# Patient Record
Sex: Female | Born: 1970 | Race: Black or African American | Hispanic: No | Marital: Single | Smoking: Never smoker
Health system: Southern US, Community
[De-identification: ages and names within clinical notes are randomized; demographics above are authoritative.]

## PROBLEM LIST (undated history)

## (undated) DIAGNOSIS — Z5189 Encounter for other specified aftercare: Secondary | ICD-10-CM

## (undated) DIAGNOSIS — R519 Headache, unspecified: Secondary | ICD-10-CM

## (undated) DIAGNOSIS — F419 Anxiety disorder, unspecified: Secondary | ICD-10-CM

## (undated) DIAGNOSIS — D219 Benign neoplasm of connective and other soft tissue, unspecified: Secondary | ICD-10-CM

## (undated) DIAGNOSIS — K219 Gastro-esophageal reflux disease without esophagitis: Secondary | ICD-10-CM

## (undated) DIAGNOSIS — M1990 Unspecified osteoarthritis, unspecified site: Secondary | ICD-10-CM

## (undated) DIAGNOSIS — I10 Essential (primary) hypertension: Secondary | ICD-10-CM

## (undated) DIAGNOSIS — D649 Anemia, unspecified: Secondary | ICD-10-CM

## (undated) HISTORY — PX: APPENDECTOMY: SHX54

---

## 2018-02-04 ENCOUNTER — Encounter (HOSPITAL_COMMUNITY): Payer: Self-pay | Admitting: Nurse Practitioner

## 2018-02-04 ENCOUNTER — Emergency Department (HOSPITAL_COMMUNITY)
Admission: EM | Admit: 2018-02-04 | Discharge: 2018-02-04 | Disposition: A | Discharge status: Home / Self Care | Payer: Self-pay | Attending: Emergency Medicine | Admitting: Emergency Medicine

## 2018-02-04 DIAGNOSIS — N939 Abnormal uterine and vaginal bleeding, unspecified: Secondary | ICD-10-CM

## 2018-02-04 DIAGNOSIS — D259 Leiomyoma of uterus, unspecified: Secondary | ICD-10-CM | POA: Insufficient documentation

## 2018-02-04 DIAGNOSIS — D649 Anemia, unspecified: Secondary | ICD-10-CM | POA: Insufficient documentation

## 2018-02-04 HISTORY — DX: Encounter for other specified aftercare: Z51.89

## 2018-02-04 HISTORY — DX: Benign neoplasm of connective and other soft tissue, unspecified: D21.9

## 2018-02-04 LAB — COMPREHENSIVE METABOLIC PANEL
ALBUMIN: 3.7 g/dL (ref 3.5–5.0)
ALT: 16 U/L (ref 14–54)
AST: 24 U/L (ref 15–41)
Alkaline Phosphatase: 52 U/L (ref 38–126)
Anion gap: 10 (ref 5–15)
BILIRUBIN TOTAL: 0.8 mg/dL (ref 0.3–1.2)
BUN: 10 mg/dL (ref 6–20)
CHLORIDE: 103 mmol/L (ref 101–111)
CO2: 24 mmol/L (ref 22–32)
Calcium: 8.8 mg/dL — ABNORMAL LOW (ref 8.9–10.3)
Creatinine, Ser: 0.97 mg/dL (ref 0.44–1.00)
GFR calc Af Amer: >60 mL/min (ref >60)
GFR calc non Af Amer: >60 mL/min (ref >60)
GLUCOSE: 99 mg/dL (ref 65–99)
POTASSIUM: 4.2 mmol/L (ref 3.5–5.1)
SODIUM: 137 mmol/L (ref 135–145)
Total Protein: 7.6 g/dL (ref 6.5–8.1)

## 2018-02-04 LAB — CBC
HCT: 36.4 % (ref 36.0–46.0)
Hemoglobin: 11.7 g/dL — ABNORMAL LOW (ref 12.0–15.0)
MCH: 27.8 pg (ref 26.0–34.0)
MCHC: 32.1 g/dL (ref 30.0–36.0)
MCV: 86.5 fL (ref 78.0–100.0)
Platelets: 267 10*3/uL (ref 150–400)
RBC: 4.21 MIL/uL (ref 3.87–5.11)
RDW: 15.5 % (ref 11.5–15.5)
WBC: 9.1 10*3/uL (ref 4.0–10.5)

## 2018-02-04 LAB — I-STAT BETA HCG BLOOD, ED (MC, WL, AP ONLY): I-stat hCG, quantitative: <5 m[IU]/mL (ref <5)

## 2018-02-04 LAB — LIPASE, BLOOD: Lipase: 26 U/L (ref 11–51)

## 2018-02-04 LAB — URINALYSIS, ROUTINE W REFLEX MICROSCOPIC
BILIRUBIN URINE: NEGATIVE
GLUCOSE, UA: NEGATIVE mg/dL
Hgb urine dipstick: NEGATIVE
KETONES UR: NEGATIVE mg/dL
Leukocytes, UA: NEGATIVE
Nitrite: NEGATIVE
PH: 6 (ref 5.0–8.0)
Protein, ur: NEGATIVE mg/dL
SPECIFIC GRAVITY, URINE: 1.024 (ref 1.005–1.030)

## 2018-02-04 MED ORDER — NAPROXEN 500 MG PO TABS: 500.0000 mg | ORAL_TABLET | Freq: Two times a day (BID) | ORAL | 0 refills | Status: DC

## 2018-02-04 MED ORDER — ONDANSETRON 4 MG PO TBDP
4.0000 mg | ORAL_TABLET | Freq: Once | ORAL | Status: AC | PRN
  Administered 2018-02-04: 4 mg via ORAL
  Filled 2018-02-04: qty 1

## 2018-02-04 MED ORDER — OXYCODONE-ACETAMINOPHEN 5-325 MG PO TABS
1.0000 | ORAL_TABLET | ORAL | Status: DC | PRN
  Administered 2018-02-04: 1 via ORAL
  Filled 2018-02-04: qty 1

## 2018-02-04 NOTE — ED Triage Notes (Signed)
Pt endorses weakness and lower abdominal pain, fatigue and had vaginal bleeding x14 days stated stopped bleeding 2 days ago. Pt denies nasuea, vomiting, diarrhea, decrease appetite, dysuria, vaginal discharge. Pt endorses feeling cold all the time. Pt has had appendectomy.   States has hx of fibroids requiring blood transfusions.

## 2018-02-04 NOTE — ED Notes (Signed)
Pt verbalized understanding of d/c instructions and has no further questions. VSS, NAD.  

## 2018-02-04 NOTE — Discharge Instructions (Signed)
Make sure you follow-up with the [facility] for evaluation of your fibroids.  Do not take ibuprofen, Aleve, Advil, or other NSAIDs while you are on the Naproxen.

## 2018-02-04 NOTE — ED Provider Notes (Signed)
[facility] EMERGENCY DEPARTMENT Provider Note   CSN: [id number] Arrival date & time: 02/04/18  1006     History   Chief Complaint No chief complaint on file.   HPI Stefanie Moon is a 47 y.o. female.  HPI  47 year old female with a history of fibroids diagnosed a few months ago in [place] presents with heavy vaginal bleeding for 2 weeks.  Is also been feeling weak and run down with no energy.  She states the bleeding is heavy with clots.  She had an ultrasound in [facility] that showed multiple uterine fibroids.  She has moved to this area but does not have an OB/GYN.  She has been having lower abdominal pain that comes and goes, especially with certain movements.  No vomiting.  Denies any dysuria or vaginal discharge.  She has tried ibuprofen without relief.  She is feeling overall weak like she has no energy.  She describes shortness of breath for several months but denies any chest pain or cough.  When noted that she has hypertension she states she is always hypertensive whenever she is in such significant pain but denies a known history of hypertension. She was given a percocet in the waiting room with partial relief.   Past Medical History:  Diagnosis Date  . Blood transfusion without reported diagnosis   . Fibroids     There are no active problems to display for this patient.   Past Surgical History:  Procedure Laterality Date  . APPENDECTOMY      OB History    No data available       Home Medications    Prior to Admission medications   Medication Sig Start Date End Date Taking? Authorizing Provider  naproxen (NAPROSYN) 500 MG tablet Take 1 tablet (500 mg total) by mouth 2 (two) times daily with a meal. 02/04/18   Sherwood Gambler, MD    Family History No family history on file.  Social History Social History   Tobacco Use  . Smoking status: Never Smoker  . Smokeless tobacco: Never Used  Substance Use Topics  . Alcohol use: Yes      Comment: socially   . Drug use: No     Allergies   Patient has no known allergies.   Review of Systems Review of Systems  Constitutional: Positive for fatigue.  Respiratory: Positive for shortness of breath.   Cardiovascular: Negative for chest pain.  Gastrointestinal: Positive for abdominal pain. Negative for vomiting.  Genitourinary: Positive for vaginal bleeding. Negative for dysuria and vaginal discharge.  Neurological: Positive for weakness.  All other systems reviewed and are negative.    Physical Exam Updated Vital Signs BP (!) 169/114   Pulse 78   Temp 98.4 F (36.9 C) (Oral)   Resp 16   Ht 5\' 4"  (1.626 m)   Wt 91.2 kg (201 lb)   LMP 02/01/2018   SpO2 98%   BMI 34.50 kg/m   Physical Exam  Constitutional: She is oriented to person, place, and time. She appears well-developed and well-nourished. No distress.  HENT:  Head: Normocephalic and atraumatic.  Right Ear: External ear normal.  Left Ear: External ear normal.  Nose: Nose normal.  Eyes: EOM are normal. Pupils are equal, round, and reactive to light. Right eye exhibits no discharge. Left eye exhibits no discharge.  Cardiovascular: Normal rate, regular rhythm and normal heart sounds.  Pulmonary/Chest: Effort normal and breath sounds normal.  Abdominal: Soft. There is tenderness in the suprapubic area.  Neurological: She is alert and oriented to person, place, and time.  CN 3-12 grossly intact. 5/5 strength in all 4 extremities. Grossly normal sensation. Normal finger to nose.   Skin: Skin is warm and dry. She is not diaphoretic.  Nursing note and vitals reviewed.    ED Treatments / Results  Labs (all labs ordered are listed, but only abnormal results are displayed) Labs Reviewed  COMPREHENSIVE METABOLIC PANEL - Abnormal; Notable for the following components:      Result Value   Calcium 8.8 (*)    All other components within normal limits  CBC - Abnormal; Notable for the following components:    Hemoglobin 11.7 (*)    All other components within normal limits  LIPASE, BLOOD  URINALYSIS, ROUTINE W REFLEX MICROSCOPIC  I-STAT BETA HCG BLOOD, ED ([facility], [facility], [facility] ONLY)    EKG  EKG Interpretation None       Radiology No results found.  Procedures Procedures (including critical care time)  Medications Ordered in ED Medications  oxyCODONE-acetaminophen (PERCOCET/ROXICET) 5-325 MG per tablet 1 tablet (1 tablet Oral Given 02/04/18 1158)  ondansetron (ZOFRAN-ODT) disintegrating tablet 4 mg (4 mg Oral Given 02/04/18 1157)     Initial Impression / Assessment and Plan / ED Course  I have reviewed the triage vital signs and the nursing notes.  Pertinent labs & imaging results that were available during my care of the patient were reviewed by me and considered in my medical decision making (see chart for details).     Patient's presentation is consistent with bleeding from fibroids.  She has mild anemia with a hemoglobin of 11.5, no old to compare to.  However her vital signs show no signs of shock.  She is actually hypertensive and we discussed getting an ECG given her feeling of weakness and fatigue but she declines.  I also offered pelvic exam but she states she knows where the vaginal bleeding is coming from and just needs someone to help stop it.  I will give her a stronger NSAID but I do not think that narcotics are warranted at this time.  She otherwise appears well.  I do not think CT imaging or ultrasound would help at this time as we know the diagnosis.  Otherwise she appears stable for discharge home and we discussed starting on iron supplementation.  Follow-up with OB/GYN.  Discussed return precautions.  My suspicion of ACS or acute neurologic emergency is low.  Final Clinical Impressions(s) / ED Diagnoses   Final diagnoses:  Uterine leiomyoma, unspecified location  Vaginal bleeding  Anemia, unspecified type    ED Discharge Orders        Ordered    naproxen (NAPROSYN) 500  MG tablet  2 times daily with meals     02/04/18 1525       Sherwood Gambler, MD 02/04/18 1530

## 2018-04-08 ENCOUNTER — Encounter (HOSPITAL_COMMUNITY): Payer: Self-pay | Admitting: *Deleted

## 2018-04-08 ENCOUNTER — Inpatient Hospital Stay (HOSPITAL_COMMUNITY)
Admission: AD | Admit: 2018-04-08 | Discharge: 2018-04-08 | Discharge status: Against Medical Advice | Payer: Self-pay | Source: Ambulatory Visit | Attending: Obstetrics and Gynecology | Admitting: Obstetrics and Gynecology

## 2018-04-08 DIAGNOSIS — Z5321 Procedure and treatment not carried out due to patient leaving prior to being seen by health care provider: Secondary | ICD-10-CM | POA: Insufficient documentation

## 2018-04-08 DIAGNOSIS — R1030 Lower abdominal pain, unspecified: Secondary | ICD-10-CM | POA: Insufficient documentation

## 2018-04-08 DIAGNOSIS — N939 Abnormal uterine and vaginal bleeding, unspecified: Secondary | ICD-10-CM | POA: Insufficient documentation

## 2018-04-08 LAB — URINALYSIS, ROUTINE W REFLEX MICROSCOPIC
BILIRUBIN URINE: NEGATIVE
Glucose, UA: NEGATIVE mg/dL
Ketones, ur: NEGATIVE mg/dL
LEUKOCYTES UA: NEGATIVE
NITRITE: NEGATIVE
PROTEIN: NEGATIVE mg/dL
Specific Gravity, Urine: 1.02 (ref 1.005–1.030)
pH: 5 (ref 5.0–8.0)

## 2018-04-08 LAB — POCT PREGNANCY, URINE: PREG TEST UR: NEGATIVE

## 2018-04-08 NOTE — MAU Note (Addendum)
Pt C/O bleeding for the past 15 days, has no energy, states she blacked out at work today, has been very dizzy.  Fills the toilet with clots when she goes to the [place].  Also C/O severe lower abd cramping & pelvic pressure.  Hx of fibroids.

## 2018-04-08 NOTE — MAU Note (Signed)
Pt informed registration staff that she is unable to stay, has to pick up her children, wants to come back later.

## 2018-06-23 ENCOUNTER — Emergency Department (HOSPITAL_COMMUNITY)
Admission: EM | Admit: 2018-06-23 | Discharge: 2018-06-23 | Disposition: A | Discharge status: Home / Self Care | Payer: Self-pay | Attending: Emergency Medicine | Admitting: Emergency Medicine

## 2018-06-23 ENCOUNTER — Emergency Department (HOSPITAL_COMMUNITY): Payer: Self-pay

## 2018-06-23 ENCOUNTER — Encounter (HOSPITAL_COMMUNITY): Payer: Self-pay | Admitting: Emergency Medicine

## 2018-06-23 DIAGNOSIS — R930 Abnormal findings on diagnostic imaging of skull and head, not elsewhere classified: Secondary | ICD-10-CM | POA: Insufficient documentation

## 2018-06-23 DIAGNOSIS — M5412 Radiculopathy, cervical region: Secondary | ICD-10-CM | POA: Insufficient documentation

## 2018-06-23 DIAGNOSIS — Z79899 Other long term (current) drug therapy: Secondary | ICD-10-CM | POA: Insufficient documentation

## 2018-06-23 LAB — CBC WITH DIFFERENTIAL/PLATELET
Basophils Absolute: 0 10*3/uL (ref 0.0–0.1)
Basophils Relative: 0 %
Eosinophils Absolute: 0.2 10*3/uL (ref 0.0–0.7)
Eosinophils Relative: 3 %
HEMATOCRIT: 29.1 % — AB (ref 36.0–46.0)
HEMOGLOBIN: 8.8 g/dL — AB (ref 12.0–15.0)
Lymphocytes Relative: 19 %
Lymphs Abs: 1.6 10*3/uL (ref 0.7–4.0)
MCH: 24.2 pg — ABNORMAL LOW (ref 26.0–34.0)
MCHC: 30.2 g/dL (ref 30.0–36.0)
MCV: 80.2 fL (ref 78.0–100.0)
Monocytes Absolute: 0.5 10*3/uL (ref 0.1–1.0)
Monocytes Relative: 6 %
NEUTROS ABS: 6.1 10*3/uL (ref 1.7–7.7)
NEUTROS PCT: 72 %
Platelets: 378 10*3/uL (ref 150–400)
RBC: 3.63 MIL/uL — AB (ref 3.87–5.11)
RDW: 14.5 % (ref 11.5–15.5)
WBC: 8.5 10*3/uL (ref 4.0–10.5)

## 2018-06-23 LAB — COMPREHENSIVE METABOLIC PANEL
ALBUMIN: 3.6 g/dL (ref 3.5–5.0)
ALK PHOS: 46 U/L (ref 38–126)
ALT: 13 U/L (ref 0–44)
AST: 16 U/L (ref 15–41)
Anion gap: 7 (ref 5–15)
BILIRUBIN TOTAL: 0.8 mg/dL (ref 0.3–1.2)
BUN: 13 mg/dL (ref 6–20)
CALCIUM: 8.7 mg/dL — AB (ref 8.9–10.3)
CO2: 25 mmol/L (ref 22–32)
CREATININE: 1.06 mg/dL — AB (ref 0.44–1.00)
Chloride: 105 mmol/L (ref 98–111)
GFR calc Af Amer: >60 mL/min (ref >60)
GFR calc non Af Amer: >60 mL/min (ref >60)
GLUCOSE: 118 mg/dL — AB (ref 70–99)
Potassium: 3.7 mmol/L (ref 3.5–5.1)
Sodium: 137 mmol/L (ref 135–145)
TOTAL PROTEIN: 7.3 g/dL (ref 6.5–8.1)

## 2018-06-23 LAB — TROPONIN I: Troponin I: <0.03 ng/mL (ref <0.03)

## 2018-06-23 MED ORDER — HYDROMORPHONE HCL 1 MG/ML IJ SOLN
1.0000 mg | Freq: Once | INTRAMUSCULAR | Status: AC
  Administered 2018-06-23: 1 mg via INTRAVENOUS
  Filled 2018-06-23: qty 1

## 2018-06-23 MED ORDER — PREDNISONE 20 MG PO TABS: ORAL_TABLET | ORAL | 0 refills | Status: DC

## 2018-06-23 MED ORDER — LORAZEPAM 2 MG/ML IJ SOLN
1.0000 mg | Freq: Once | INTRAMUSCULAR | Status: AC
  Administered 2018-06-23: 1 mg via INTRAVENOUS
  Filled 2018-06-23: qty 1

## 2018-06-23 MED ORDER — METHYLPREDNISOLONE SODIUM SUCC 125 MG IJ SOLR
125.0000 mg | Freq: Once | INTRAMUSCULAR | Status: AC
  Administered 2018-06-23: 125 mg via INTRAVENOUS
  Filled 2018-06-23: qty 2

## 2018-06-23 MED ORDER — SODIUM CHLORIDE 0.9 % IV BOLUS: 1000.0000 mL | Freq: Once | INTRAVENOUS | Status: DC

## 2018-06-23 MED ORDER — MORPHINE SULFATE (PF) 4 MG/ML IV SOLN
4.0000 mg | Freq: Once | INTRAVENOUS | Status: AC
  Administered 2018-06-23: 4 mg via INTRAVENOUS
  Filled 2018-06-23: qty 1

## 2018-06-23 MED ORDER — GABAPENTIN 300 MG PO CAPS: 300.0000 mg | ORAL_CAPSULE | Freq: Two times a day (BID) | ORAL | 0 refills | Status: DC

## 2018-06-23 MED ORDER — GADOBENATE DIMEGLUMINE 529 MG/ML IV SOLN
20.0000 mL | Freq: Once | INTRAVENOUS | Status: AC | PRN
  Administered 2018-06-23: 20 mL via INTRAVENOUS

## 2018-06-23 NOTE — ED Notes (Signed)
Report given to MRI.

## 2018-06-23 NOTE — Discharge Instructions (Signed)
Take steroids as prescribed. You have a pinched nerve in your neck causing your symptoms.   Take gabapentin as prescribed.   See neurology for nonspecific changes in your MRI brain   See neurosurgeon for the pinched nerve in your neck   Return to ER if you have worse numbness, weakness, trouble speaking, blurry vision

## 2018-06-23 NOTE — ED Notes (Signed)
Pt transported to Xray at this time.

## 2018-06-23 NOTE — ED Triage Notes (Signed)
Pt reports started a new job 4 months ago where does a lot of work with her hands/arms. Pt repots having pains, numbness and swelling in bilat upper extremities that got to yesterday she wasn't able to hold onto things and dropping.

## 2018-06-23 NOTE — ED Provider Notes (Signed)
[facility] DEPT Provider Note   CSN: [id number] Arrival date & time: 06/23/18  0950     History   Chief Complaint Chief Complaint  Patient presents with  . Arm Pain  . Hand Pain    HPI Stefanie Moon is a 47 y.o. female hx of fibroids, here presenting with numbness, weakness.  Patient states that she is at her current job for the last 4 months.  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  For the last week or so, she has progressive bilateral arm numbness and tingling.  She also noticed that since yesterday she has been feeling weak on the right arm and has been dropping things.  She has intermittent blurry vision for the last several months that is not getting worse.  Denies trouble walking or trouble speaking.  Patient denies any neck injury or fevers and adamantly denies any drug use. Had no previous history of neck problems or multiple sclerosis.   The history is provided by the patient.    Past Medical History:  Diagnosis Date  . Blood transfusion without reported diagnosis   . Fibroids     There are no active problems to display for this patient.   Past Surgical History:  Procedure Laterality Date  . APPENDECTOMY       OB History   None      Home Medications    Prior to Admission medications   Medication Sig Start Date End Date Taking? Authorizing Provider  Cyanocobalamin (VITAMIN B-12 PO) Take 5,000 mcg by mouth daily.   Yes [provider]  ibuprofen (ADVIL,MOTRIN) 800 MG tablet Take 800 mg by mouth See admin instructions. Pt received one tablet from cousin, does not have any more.   Yes [provider]  naproxen (NAPROSYN) 500 MG tablet Take 1 tablet (500 mg total) by mouth 2 (two) times daily with a meal. 02/04/18  Yes Sherwood Gambler, MD  naproxen sodium (ALEVE) 220 MG tablet Take 440 mg by mouth daily as needed (pain).   Yes [provider]  polyethylene glycol powder  (MIRALAX) powder Take 1 Container by mouth daily as needed for mild constipation.   Yes [provider]    Family History No family history on file.  Social History Social History   Tobacco Use  . Smoking status: Never Smoker  . Smokeless tobacco: Never Used  Substance Use Topics  . Alcohol use: Yes    Comment: socially   . Drug use: No     Allergies   Tramadol   Review of Systems Review of Systems  Neurological: Positive for weakness and numbness.  All other systems reviewed and are negative.    Physical Exam Updated Vital Signs BP (!) 153/97 (BP Location: Left Arm)   Pulse 96   Temp 98.2 F (36.8 C) (Oral)   Resp 16   LMP 06/09/2018   SpO2 97%   Physical Exam  Constitutional: She is oriented to person, place, and time. She appears well-developed and well-nourished.  HENT:  Head: Normocephalic.  Mouth/Throat: Oropharynx is clear and moist.  Eyes: Pupils are equal, round, and reactive to light. Conjunctivae and EOM are normal.  Neck: Normal range of motion. Neck supple.  Mild bilateral paracervical tenderness   Cardiovascular: Normal rate, regular rhythm and normal heart sounds.  Pulmonary/Chest: Effort normal and breath sounds normal. No stridor. No respiratory distress. She has no wheezes.  Abdominal: Soft. Bowel sounds are normal. She  exhibits no distension. There is no tenderness. There is no guarding.  Musculoskeletal: Normal range of motion.  Neurological: She is alert and oriented to person, place, and time.  CN 2- 12 intact. Slightly dec sensation bilateral 3rd to 5th fingers. Slightly dec hand grasp bilaterally but nl elbow flexion and extension. Nl finger to nose bilaterally, nl strength bilateral lower extremities   Skin: Skin is warm.  Psychiatric: She has a normal mood and affect.  Nursing note and vitals reviewed.    ED Treatments / Results  Labs (all labs ordered are listed, but only abnormal results are displayed) Labs Reviewed    CBC WITH DIFFERENTIAL/PLATELET - Abnormal; Notable for the following components:      Result Value   RBC 3.63 (*)    Hemoglobin 8.8 (*)    HCT 29.1 (*)    MCH 24.2 (*)    All other components within normal limits  COMPREHENSIVE METABOLIC PANEL - Abnormal; Notable for the following components:   Glucose, Bld 118 (*)    Creatinine, Ser 1.06 (*)    Calcium 8.7 (*)    All other components within normal limits  TROPONIN I    EKG EKG Interpretation  Date/Time:  Thursday June 23 2018 10:55:28 EDT Ventricular Rate:  70 PR Interval:    QRS Duration: 76 QT Interval:  394 QTC Calculation: 426 R Axis:   24 Text Interpretation:  Sinus rhythm Abnormal R-wave progression, early transition No previous ECGs available Confirmed by Wandra Arthurs [phone]) on 06/23/2018 11:00:52 AM Also confirmed by Wandra Arthurs [phone]), editor Lynder Parents [phone])  on 06/23/2018 2:06:03 PM   Radiology Dg Chest 2 View  Result Date: 06/23/2018 CLINICAL DATA:  Worsening bilat arm pain, tingling and loss of ability to hold things over last 2 months, some new chest pain, dizziness, sob on exertion same time frame, nonsmoker, no other chest complaints EXAM: CHEST - 2 VIEW COMPARISON:  none FINDINGS: Lungs are clear. Heart size and mediastinal contours are within normal limits. No effusion.  No pneumothorax. Visualized bones unremarkable. IMPRESSION: No acute cardiopulmonary disease. Electronically Signed   By: Lucrezia Europe M.D.   On: 06/23/2018 10:56   Mr Brain W And Wo Contrast  Result Date: 06/23/2018 CLINICAL DATA:  Dizziness and blurry vision. Bilateral arm numbness and weakness. Multiple sclerosis, new event EXAM: MRI HEAD WITHOUT AND WITH CONTRAST MRI CERVICAL SPINE WITHOUT AND WITH CONTRAST TECHNIQUE: Multiplanar, multiecho pulse sequences of the brain and surrounding structures, and cervical spine, to include the craniocervical junction and cervicothoracic junction, were obtained without and with intravenous  contrast. CONTRAST:  33mL MULTIHANCE GADOBENATE DIMEGLUMINE 529 MG/ML IV SOLN COMPARISON:  None. FINDINGS: MRI HEAD FINDINGS Brain: 3 FLAIR hyperintense foci in the cerebral white matter, juxtacortical in the right temporal pole and in the deep white matter in the left cerebrum. No infratentorial signal abnormality. No enhancement or restricted diffusion. No acute infarct, hemorrhage, hydrocephalus, or collection. Brain volume is normal Vascular: Major flow voids and vascular enhancements are preserved Skull and upper cervical spine: No evidence of marrow lesion. Sinuses/Orbits: T2 hyperintense lobulation within the left sphenoid sinus that is primarily nonenhancing. There is a central T1 hyperintense focus that is likely proteinaceous material. This is most consistent with a retention cyst. No indication of acute sinusitis. MRI CERVICAL SPINE FINDINGS Alignment: Straightening of the cervical spine Vertebrae: No fracture, evidence of discitis, or bone lesion. Cord: Normal signal and morphology. 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 Disc levels: C2-3: Minor right-sided facet spurring on sagittal images. Tiny central protrusion C3-4: Disc narrowing and bulging with endplate ridging. The canal and foramina remain patent C4-5: Mild disc narrowing and bulging with minimal ridging. Patent canal and foramina C5-6: Spondylosis. Disc narrowing and bulging with asymmetric left uncovertebral ridging. Moderate left foraminal stenosis. Leftward disc osteophyte complex mildly flattens the left cord C6-7: Shallow central protrusion.  Patent canal and foramina C7-T1:Unremarkable. IMPRESSION: Brain MRI: 1. 3 small signal abnormalities in the cerebral white matter with nonspecific pattern. 2. No acute finding.  No evidence of active demyelination. Cervical MRI: 1. No evidence of cord demyelination or other acute disease. 2. 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 Disc osteophyte complex at the same level causes mild left ventral cord flattening. Electronically Signed   By: Monte Fantasia M.D.   On: 06/23/2018 13:10   Mr Cervical Spine W Or Wo Contrast  Result Date: 06/23/2018 CLINICAL DATA:  Dizziness and blurry vision. Bilateral arm numbness and weakness. Multiple sclerosis, new event EXAM: MRI HEAD WITHOUT AND WITH CONTRAST MRI CERVICAL SPINE WITHOUT AND WITH CONTRAST TECHNIQUE: Multiplanar, multiecho pulse sequences of the brain and surrounding structures, and cervical spine, to include the craniocervical junction and cervicothoracic junction, were obtained without and with intravenous contrast. CONTRAST:  61mL MULTIHANCE GADOBENATE DIMEGLUMINE 529 MG/ML IV SOLN COMPARISON:  None. FINDINGS: MRI HEAD FINDINGS Brain: 3 FLAIR hyperintense foci in the cerebral white matter, juxtacortical in the right temporal pole and in the deep white matter in the left cerebrum. No infratentorial signal abnormality. No enhancement or restricted diffusion. No acute infarct, hemorrhage, hydrocephalus, or collection. Brain volume is normal Vascular: Major flow voids and vascular enhancements are preserved Skull and upper cervical spine: No evidence of marrow lesion. Sinuses/Orbits: T2 hyperintense lobulation within the left sphenoid sinus that is primarily nonenhancing. There is a central T1 hyperintense focus that is likely proteinaceous material. This is most consistent with a retention cyst. No indication of acute sinusitis. MRI CERVICAL SPINE FINDINGS Alignment: Straightening of the cervical spine Vertebrae: No fracture, evidence of discitis, or bone lesion. Cord: Normal signal and morphology. 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 Disc levels: C2-3: Minor right-sided facet spurring on sagittal images. Tiny central protrusion C3-4: Disc narrowing and bulging with endplate ridging. The  canal and foramina remain patent C4-5: Mild disc narrowing and bulging with minimal ridging. Patent canal and foramina C5-6: Spondylosis. Disc narrowing and bulging with asymmetric left uncovertebral ridging. Moderate left foraminal stenosis. Leftward disc osteophyte complex mildly flattens the left cord C6-7: Shallow central protrusion.  Patent canal and foramina C7-T1:Unremarkable. IMPRESSION: Brain MRI: 1. 3 small signal abnormalities in the cerebral white matter with nonspecific pattern. 2. No acute finding.  No evidence of active demyelination. Cervical MRI: 1. No evidence of cord demyelination or other acute disease. 2. Multilevel disc degeneration with moderate left foraminal narrowing at C5-6. Disc osteophyte complex at the same level causes mild left ventral cord flattening. Electronically Signed   By: Monte Fantasia M.D.   On: 06/23/2018 13:10    Procedures Procedures (including critical care time)  Medications Ordered in ED Medications  morphine 4 MG/ML injection 4 mg (4 mg Intravenous Given 06/23/18 1020)  HYDROmorphone (DILAUDID) injection 1 mg (1 mg Intravenous Given 06/23/18 1107)  LORazepam (ATIVAN) injection 1 mg (1 mg Intravenous Given 06/23/18 1107)  gadobenate dimeglumine (MULTIHANCE) injection 20 mL (20 mLs Intravenous Contrast Given 06/23/18 1250)  methylPREDNISolone sodium succinate (SOLU-MEDROL) 125 mg/2 mL injection 125 mg (125 mg Intravenous Given 06/23/18 1414)     Initial Impression / Assessment and Plan / ED Course  I have reviewed the triage vital signs and the nursing notes.  Pertinent labs & imaging results that were available during my care of the patient were reviewed by me and considered in my medical decision making (see chart for details).     Stefanie Moon is a 47 y.o. female here with bilateral arm numbness, weakness. Consider peripheral radiculopathy vs multiple sclerosis vs bilateral carpel tunnel syndrome. Will get labs, MRI brain/cervical spine. Will  give pain meds.   2:17 PM MRI showed nonspecific signal abnormalities in the cerebral white matter but no signs of MS. MRI cervical spine showed C5-6 osteophyte complex. I think this likely caused her radiculopathy. I talked to Dr. Leonel Ramsay from neurology. He recommend outpatient neurology follow up. I ordered steroids. Will dc home with steroid dose pack, neurosurgery referral as well.    Final Clinical Impressions(s) / ED Diagnoses   Final diagnoses:  None    ED Discharge Orders    None       Drenda Freeze, MD 06/23/18 1421

## 2018-06-23 NOTE — ED Notes (Signed)
Pt reports pain is unchanged. MD made aware

## 2018-06-23 NOTE — ED Notes (Signed)
PA at bedside.

## 2018-06-23 NOTE — ED Notes (Signed)
Pt transported to MRI at this time 

## 2018-09-01 ENCOUNTER — Encounter (HOSPITAL_COMMUNITY): Payer: Self-pay | Admitting: *Deleted

## 2018-09-01 ENCOUNTER — Emergency Department (HOSPITAL_COMMUNITY): Payer: Self-pay

## 2018-09-01 ENCOUNTER — Other Ambulatory Visit: Payer: Self-pay

## 2018-09-01 ENCOUNTER — Emergency Department (HOSPITAL_COMMUNITY)
Admission: EM | Admit: 2018-09-01 | Discharge: 2018-09-01 | Disposition: A | Discharge status: Home / Self Care | Payer: Self-pay | Attending: Emergency Medicine | Admitting: Emergency Medicine

## 2018-09-01 DIAGNOSIS — R0602 Shortness of breath: Secondary | ICD-10-CM | POA: Insufficient documentation

## 2018-09-01 DIAGNOSIS — B349 Viral infection, unspecified: Secondary | ICD-10-CM | POA: Insufficient documentation

## 2018-09-01 DIAGNOSIS — R070 Pain in throat: Secondary | ICD-10-CM | POA: Insufficient documentation

## 2018-09-01 DIAGNOSIS — R110 Nausea: Secondary | ICD-10-CM | POA: Insufficient documentation

## 2018-09-01 DIAGNOSIS — R1030 Lower abdominal pain, unspecified: Secondary | ICD-10-CM | POA: Insufficient documentation

## 2018-09-01 DIAGNOSIS — R509 Fever, unspecified: Secondary | ICD-10-CM | POA: Insufficient documentation

## 2018-09-01 DIAGNOSIS — H1033 Unspecified acute conjunctivitis, bilateral: Secondary | ICD-10-CM | POA: Insufficient documentation

## 2018-09-01 DIAGNOSIS — R55 Syncope and collapse: Secondary | ICD-10-CM | POA: Insufficient documentation

## 2018-09-01 LAB — COMPREHENSIVE METABOLIC PANEL
ALT: 16 U/L (ref 0–44)
AST: 19 U/L (ref 15–41)
Albumin: 3.7 g/dL (ref 3.5–5.0)
Alkaline Phosphatase: 51 U/L (ref 38–126)
Anion gap: 10 (ref 5–15)
BILIRUBIN TOTAL: 0.7 mg/dL (ref 0.3–1.2)
BUN: 10 mg/dL (ref 6–20)
CO2: 26 mmol/L (ref 22–32)
Calcium: 8.9 mg/dL (ref 8.9–10.3)
Chloride: 103 mmol/L (ref 98–111)
Creatinine, Ser: 1.03 mg/dL — ABNORMAL HIGH (ref 0.44–1.00)
GFR calc non Af Amer: >60 mL/min (ref >60)
Glucose, Bld: 101 mg/dL — ABNORMAL HIGH (ref 70–99)
POTASSIUM: 3.4 mmol/L — AB (ref 3.5–5.1)
Sodium: 139 mmol/L (ref 135–145)
TOTAL PROTEIN: 8.1 g/dL (ref 6.5–8.1)

## 2018-09-01 LAB — CBC WITH DIFFERENTIAL/PLATELET
BASOS ABS: 0 10*3/uL (ref 0.0–0.1)
Basophils Relative: 0 %
EOS ABS: 0.1 10*3/uL (ref 0.0–0.7)
Eosinophils Relative: 2 %
HCT: 29.4 % — ABNORMAL LOW (ref 36.0–46.0)
Hemoglobin: 8.9 g/dL — ABNORMAL LOW (ref 12.0–15.0)
Lymphocytes Relative: 18 %
Lymphs Abs: 1.3 10*3/uL (ref 0.7–4.0)
MCH: 22.5 pg — ABNORMAL LOW (ref 26.0–34.0)
MCHC: 30.3 g/dL (ref 30.0–36.0)
MCV: 74.4 fL — ABNORMAL LOW (ref 78.0–100.0)
Monocytes Absolute: 1 10*3/uL (ref 0.1–1.0)
Monocytes Relative: 14 %
Neutro Abs: 4.7 10*3/uL (ref 1.7–7.7)
Neutrophils Relative %: 66 %
PLATELETS: 292 10*3/uL (ref 150–400)
RBC: 3.95 MIL/uL (ref 3.87–5.11)
RDW: 16.2 % — ABNORMAL HIGH (ref 11.5–15.5)
WBC: 7.1 10*3/uL (ref 4.0–10.5)

## 2018-09-01 LAB — URINALYSIS, ROUTINE W REFLEX MICROSCOPIC
BILIRUBIN URINE: NEGATIVE
Glucose, UA: NEGATIVE mg/dL
Hgb urine dipstick: NEGATIVE
Ketones, ur: NEGATIVE mg/dL
LEUKOCYTES UA: NEGATIVE
Nitrite: NEGATIVE
PROTEIN: NEGATIVE mg/dL
Specific Gravity, Urine: 1.012 (ref 1.005–1.030)
pH: 6 (ref 5.0–8.0)

## 2018-09-01 LAB — LIPASE, BLOOD: LIPASE: 28 U/L (ref 11–51)

## 2018-09-01 LAB — PREGNANCY, URINE: PREG TEST UR: NEGATIVE

## 2018-09-01 MED ORDER — MORPHINE SULFATE (PF) 4 MG/ML IV SOLN
4.0000 mg | Freq: Once | INTRAVENOUS | Status: AC
  Administered 2018-09-01: 4 mg via INTRAVENOUS
  Filled 2018-09-01: qty 1

## 2018-09-01 MED ORDER — SODIUM CHLORIDE 0.9 % IJ SOLN
INTRAMUSCULAR | Status: AC
  Filled 2018-09-01: qty 50

## 2018-09-01 MED ORDER — SODIUM CHLORIDE 0.9 % IV BOLUS
1000.0000 mL | Freq: Once | INTRAVENOUS | Status: AC
Start: 2018-09-01 — End: 2018-09-01
  Administered 2018-09-01: 1000 mL via INTRAVENOUS

## 2018-09-01 MED ORDER — ARTIFICIAL TEARS OPHTHALMIC OINT
TOPICAL_OINTMENT | Freq: Once | OPHTHALMIC | Status: AC
  Administered 2018-09-01: 10:00:00 via OPHTHALMIC
  Filled 2018-09-01: qty 3.5

## 2018-09-01 MED ORDER — ONDANSETRON HCL 4 MG/2ML IJ SOLN
4.0000 mg | Freq: Once | INTRAMUSCULAR | Status: AC
  Administered 2018-09-01: 4 mg via INTRAVENOUS
  Filled 2018-09-01: qty 2

## 2018-09-01 MED ORDER — IOPAMIDOL (ISOVUE-300) INJECTION 61%
INTRAVENOUS | Status: AC
  Administered 2018-09-01: 100 mL
  Filled 2018-09-01: qty 100

## 2018-09-01 NOTE — ED Triage Notes (Signed)
Pt complains of burning in eyes, generalized body aches, chills since coming [place] from the beach last week. Pt states she lost consciousness last night when she got out of bed to go to the bathroom. Pt states she had temperature of 102 last night.

## 2018-09-01 NOTE — ED Provider Notes (Signed)
[facility] DEPT Provider Note   CSN: [id number] Arrival date & time: 09/01/18  0848     History   Chief Complaint Chief Complaint  Patient presents with  . Eye Pain  . Generalized Body Aches    HPI Stefanie Moon is a 47 y.o. female.  She is complaining of being sick since she went to the beach on Thursday.  She said she was in the ocean on Saturday and on Sunday morning she woke up with eye redness sore throat painful neck glands nausea shortness of breath.  She is had the symptoms since then.  Last night getting up out of bed she had a syncopal event and then was found to have a fever of 102 by her boyfriend.  She continues to have red eyes with some watery discharge that are crusted in the morning.  Associate with some photophobia.  No sick contacts.  No vomiting no diarrhea, he is constipated and is trying some Dulcolax.  No urinary symptoms no vaginal bleeding or discharge.  The history is provided by the patient.  Illness  This is a new problem. Episode onset: 4 days. The problem occurs constantly. The problem has been gradually worsening. Associated symptoms include abdominal pain, headaches and shortness of breath. Pertinent negatives include no chest pain. Nothing aggravates the symptoms. Nothing relieves the symptoms. She has tried nothing for the symptoms. The treatment provided no relief.    Past Medical History:  Diagnosis Date  . Blood transfusion without reported diagnosis   . Fibroids     There are no active problems to display for this patient.   Past Surgical History:  Procedure Laterality Date  . APPENDECTOMY       OB History   None      Home Medications    Prior to Admission medications   Medication Sig Start Date End Date Taking? Authorizing Provider  Cyanocobalamin (VITAMIN B-12 PO) Take 5,000 mcg by mouth daily.    [provider]  gabapentin (NEURONTIN) 300 MG capsule Take 1 capsule (300 mg total) by  mouth 2 (two) times daily. 06/23/18   Drenda Freeze, MD  naproxen (NAPROSYN) 500 MG tablet Take 1 tablet (500 mg total) by mouth 2 (two) times daily with a meal. 02/04/18   Sherwood Gambler, MD  predniSONE (DELTASONE) 20 MG tablet Take 60 mg daily x 2 days then 40 mg daily x 2 days then 20 mg daily x 2 days 06/23/18   Drenda Freeze, MD    Family History No family history on file.  Social History Social History   Tobacco Use  . Smoking status: Never Smoker  . Smokeless tobacco: Never Used  Substance Use Topics  . Alcohol use: Yes    Comment: socially   . Drug use: No     Allergies   Tramadol   Review of Systems Review of Systems  Constitutional: Positive for appetite change and fever.  HENT: Positive for ear pain and sore throat.   Eyes: Positive for redness and itching.  Respiratory: Positive for shortness of breath. Negative for cough.   Cardiovascular: Negative for chest pain.  Gastrointestinal: Positive for abdominal pain, constipation and nausea. Negative for blood in stool, diarrhea and vomiting.  Genitourinary: Negative for dysuria, frequency, vaginal bleeding and vaginal discharge.  Musculoskeletal: Negative for joint swelling.  Skin: Negative for rash and wound.  Neurological: Positive for syncope and headaches. Negative for seizures.     Physical Exam Updated Vital Signs  BP (!) 151/98 (BP Location: Right Arm)   Pulse 95   Temp 98.3 F (36.8 C)   Resp 18   LMP 08/18/2018   SpO2 100%   Physical Exam  Constitutional: She appears well-developed and well-nourished. No distress.  HENT:  Head: Normocephalic and atraumatic.  Eyes: Pupils are equal, round, and reactive to light. EOM and lids are normal. Right eye exhibits discharge (watery). Left eye exhibits discharge. Right conjunctiva is injected. Left conjunctiva is injected.  Neck: Neck supple.  Cardiovascular: Normal rate and regular rhythm.  No murmur heard. Pulmonary/Chest: Effort normal and  breath sounds normal. No respiratory distress.  Abdominal: Soft. There is generalized tenderness (mild vague). There is no rigidity and no guarding.  Musculoskeletal: She exhibits no edema.  Neurological: She is alert.  Skin: Skin is warm and dry.  Psychiatric: She has a normal mood and affect.  Nursing note and vitals reviewed.    ED Treatments / Results  Labs (all labs ordered are listed, but only abnormal results are displayed) Labs Reviewed  COMPREHENSIVE METABOLIC PANEL - Abnormal; Notable for the following components:      Result Value   Potassium 3.4 (*)    Glucose, Bld 101 (*)    Creatinine, Ser 1.03 (*)    All other components within normal limits  CBC WITH DIFFERENTIAL/PLATELET - Abnormal; Notable for the following components:   Hemoglobin 8.9 (*)    HCT 29.4 (*)    MCV 74.4 (*)    MCH 22.5 (*)    RDW 16.2 (*)    All other components within normal limits  LIPASE, BLOOD  URINALYSIS, ROUTINE W REFLEX MICROSCOPIC  PREGNANCY, URINE    EKG None  Radiology Dg Chest 2 View  Result Date: 09/01/2018 CLINICAL DATA:  Short of breath EXAM: CHEST - 2 VIEW COMPARISON:  06/23/2018 FINDINGS: The heart size and mediastinal contours are within normal limits. Both lungs are clear. The visualized skeletal structures are unremarkable. IMPRESSION: No active cardiopulmonary disease. Electronically Signed   By: Franchot Gallo M.D.   On: 09/01/2018 09:59   Ct Abdomen Pelvis W Contrast  Result Date: 09/01/2018 CLINICAL DATA:  Abdominal pain with chills EXAM: CT ABDOMEN AND PELVIS WITH CONTRAST TECHNIQUE: Multidetector CT imaging of the abdomen and pelvis was performed using the standard protocol following bolus administration of intravenous contrast. CONTRAST:  174mL ISOVUE-300 IOPAMIDOL (ISOVUE-300) INJECTION 61% COMPARISON:  None. FINDINGS: Lower chest: Linear atelectasis in the left base and right middle lobe. No pleural effusion. Normal heart size. Hepatobiliary: Gallstones. No focal  hepatic abnormality or biliary dilatation Pancreas: Unremarkable. No pancreatic ductal dilatation or surrounding inflammatory changes. Spleen: Normal in size without focal abnormality. Adrenals/Urinary Tract: Adrenal glands are unremarkable. Kidneys are normal, without renal calculi, focal lesion, or hydronephrosis. Bladder is unremarkable. Stomach/Bowel: Stomach is within normal limits. Status post appendectomy. No evidence of bowel wall thickening, distention, or inflammatory changes. Vascular/Lymphatic: No significant vascular findings are present. No enlarged abdominal or pelvic lymph nodes. Reproductive: Markedly enlarged uterus. Multiple enhancing masses, some with calcification. No adnexal mass. Other: Negative for free air or free fluid. Small fatty hernias in the umbilical region Musculoskeletal: No acute or significant osseous findings. IMPRESSION: 1. No CT evidence for acute intra-abdominal or pelvic abnormality. 2. Gallstones 3. Markedly enlarged uterus with multiple masses, presumably fibroids. Electronically Signed   By: Donavan Foil M.D.   On: 09/01/2018 14:18    Procedures Procedures (including critical care time)  Medications Ordered in ED Medications  ondansetron (ZOFRAN) injection  4 mg (has no administration in time range)  sodium chloride 0.9 % bolus 1,000 mL (has no administration in time range)  morphine 4 MG/ML injection 4 mg (has no administration in time range)     Initial Impression / Assessment and Plan / ED Course  I have reviewed the triage vital signs and the nursing notes.  Pertinent labs & imaging results that were available during my care of the patient were reviewed by me and considered in my medical decision making (see chart for details).  Clinical Course as of Sep 01 1913  Thu Sep 01, 2018  1319 Patient with bilateral conjunctival injection and various other complaints including sore throat and fever and nausea and abdominal pain.  She does have some  injection on exam but otherwise nothing obvious to explain her symptoms.  Her lab work so far has been unrevealing with a normal WBC.  Her H&H is low but baseline compared to 2 months ago.  Electrolytes renal function and LFTs normal.  Urinalysis negative.  She still pending an abdominal CT.  We gave her some Lacri-Lube with improvement in her eye symptoms.   [MB]  0813 CT with no acute findings.  They do comment upon gallstones and fibroids.   [MB]  1502 Patient comfortable going home and will follow up with her doctors.  She understands to return if any worsening symptoms.   [MB]    Clinical Course User Index [MB] Hayden Rasmussen, MD      Final Clinical Impressions(s) / ED Diagnoses   Final diagnoses:  Acute conjunctivitis of both eyes, unspecified acute conjunctivitis type  Lower abdominal pain  Viral syndrome    ED Discharge Orders    None       Hayden Rasmussen, MD 09/01/18 [phone]

## 2018-09-01 NOTE — ED Notes (Signed)
Patient verbalized understanding of discharge instructions, no questions. Patient ambulated out of ED with steady gait in no distress.  

## 2018-09-01 NOTE — Discharge Instructions (Signed)
You were evaluated in the emergency department for some redness in her eyes along with fevers and low abdominal pain.  You had blood work urinalysis CAT scan that did not show an obvious cause of your symptoms.  This may be related to your fibroids.  They also saw gallstones on your CAT scan but no signs of a gallbladder attack.  You can continue to use the eye lubrication medicine to help the burning in your eyes.  Please follow-up with your doctor and return if any worsening symptoms.

## 2019-06-27 IMAGING — CT CT ABD-PELV W/ CM
2 of 5 series · 17 of 46 positions shown, 19 images · IV contrast (ISOVUE)
Comparison: None.

CLINICAL DATA: Abdominal pain with chills

EXAM:
CT ABDOMEN AND PELVIS WITH CONTRAST
TECHNIQUE: Multidetector CT imaging of the abdomen and pelvis was performed
using the standard protocol following bolus administration of
intravenous contrast.
CONTRAST:  100mL MTY4XK-SLL IOPAMIDOL (MTY4XK-SLL) INJECTION 61%

[Series 2: axial st · axial · 0.74mm/px · z∈[+1298,+1693]mm · 14 of 91 slices shown, 16 images]
[im 6/91  soft-tissue]
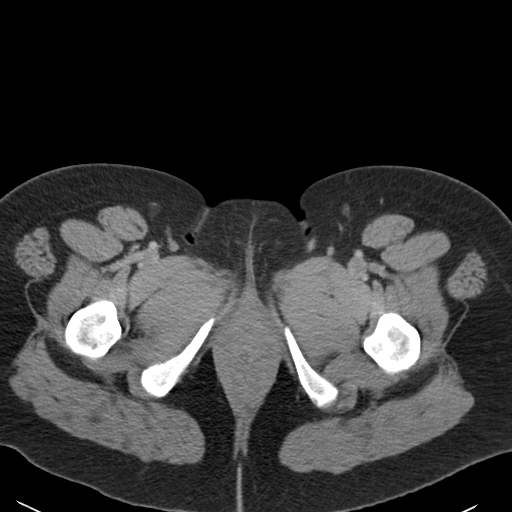
[im 6/91  bone]
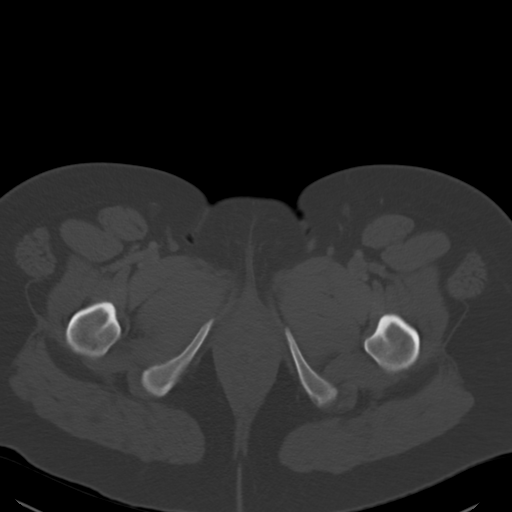
[im 12/91  soft-tissue]
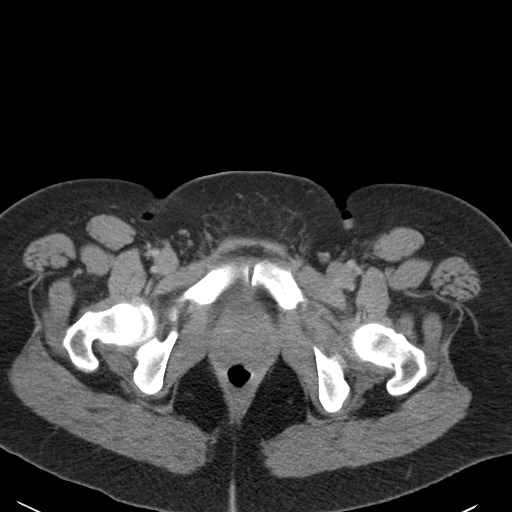
[im 17/91  soft-tissue]
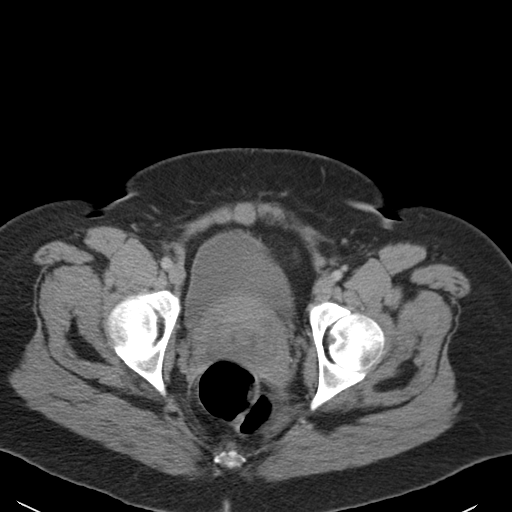
[im 23/91  soft-tissue]
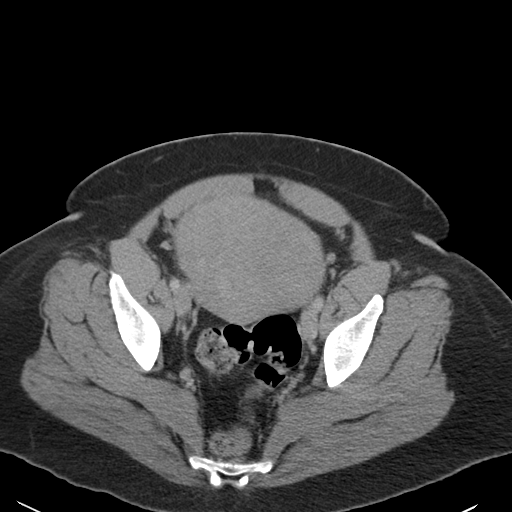
[im 29/91  soft-tissue]
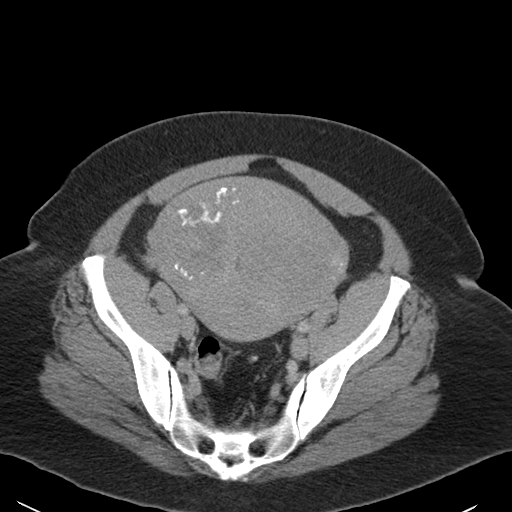
[im 34/91  soft-tissue]
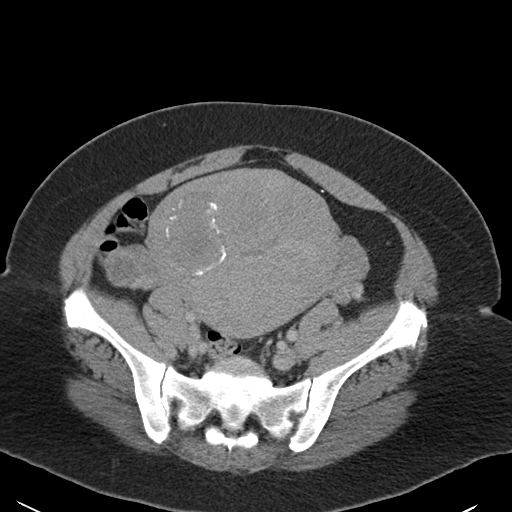
[im 40/91  soft-tissue]
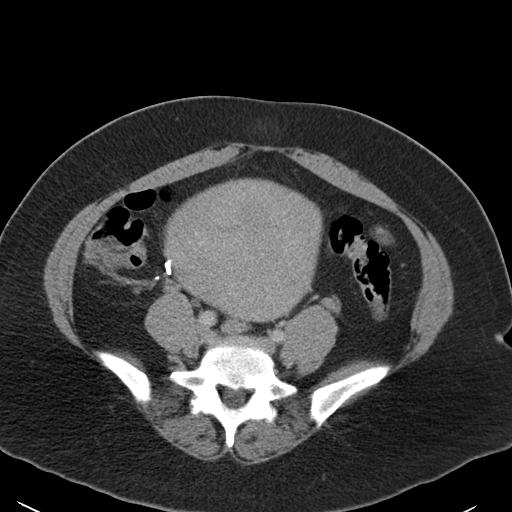
[im 51/91  soft-tissue]
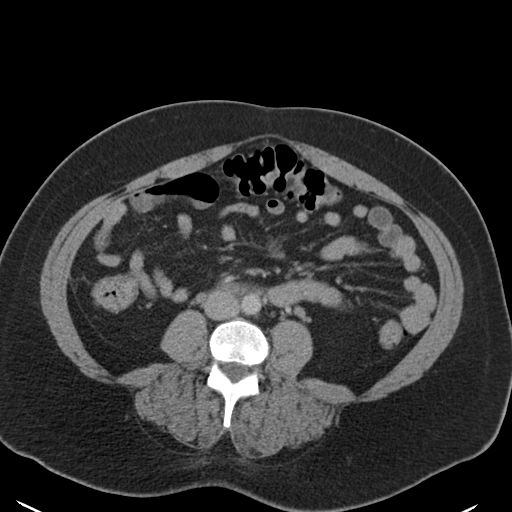
[im 57/91  soft-tissue]
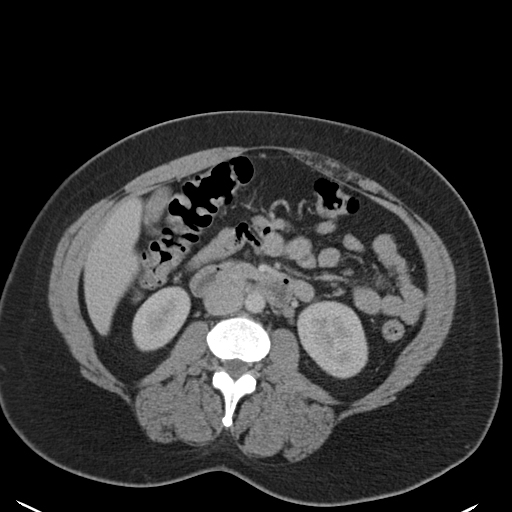
[im 57/91  bone]
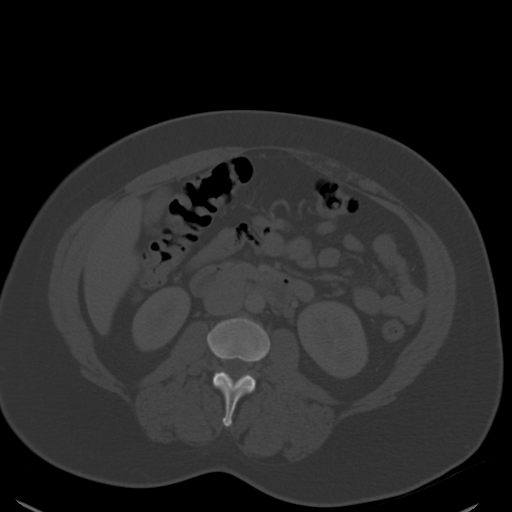
[im 62/91  soft-tissue]
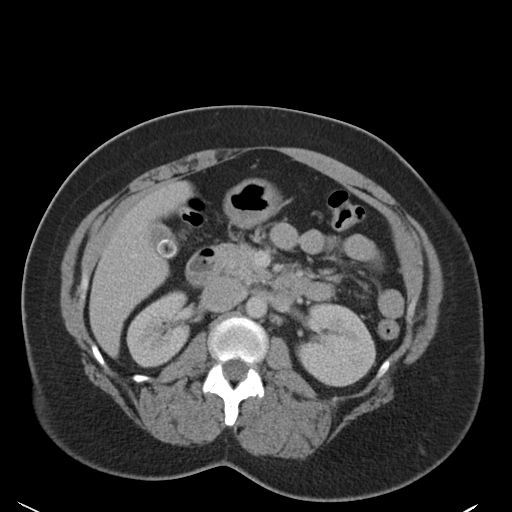
[im 68/91  soft-tissue]
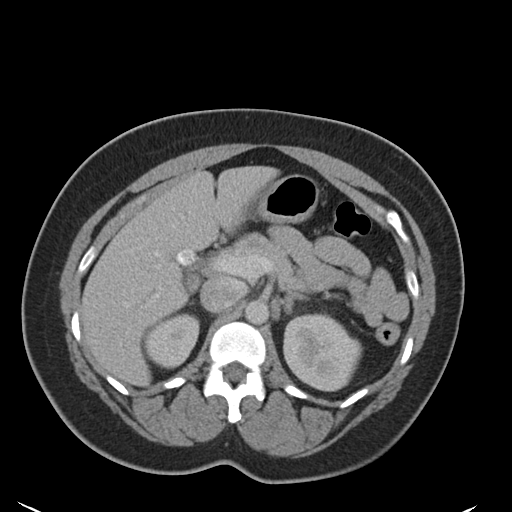
[im 74/91  soft-tissue]
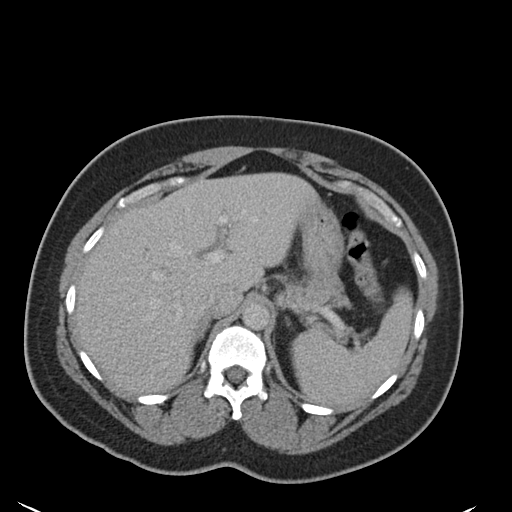
[im 79/91  soft-tissue]
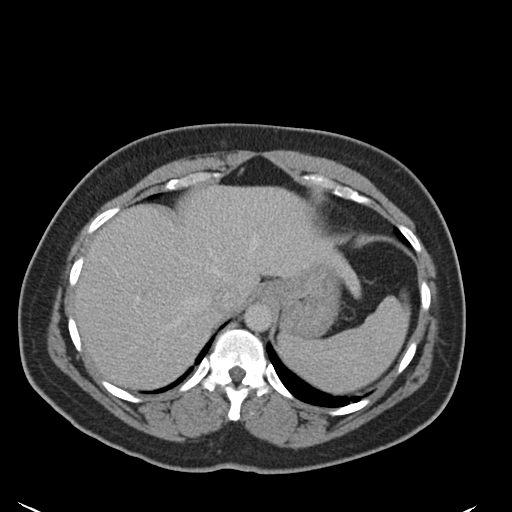
[im 85/91  soft-tissue]
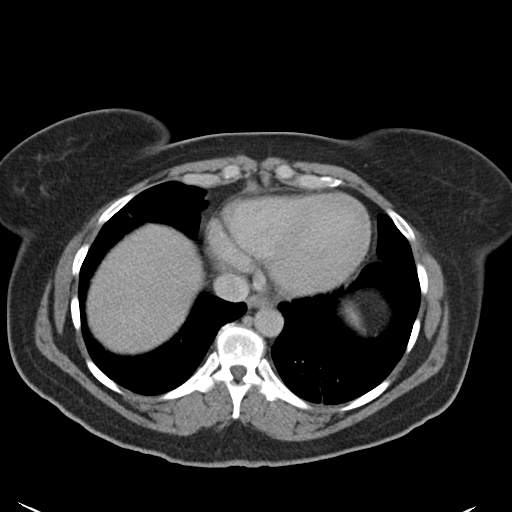

[Series 4: coronal st · coronal · 0.93mm/px · 3 of 94 slices shown]
[im 32/94  soft-tissue]
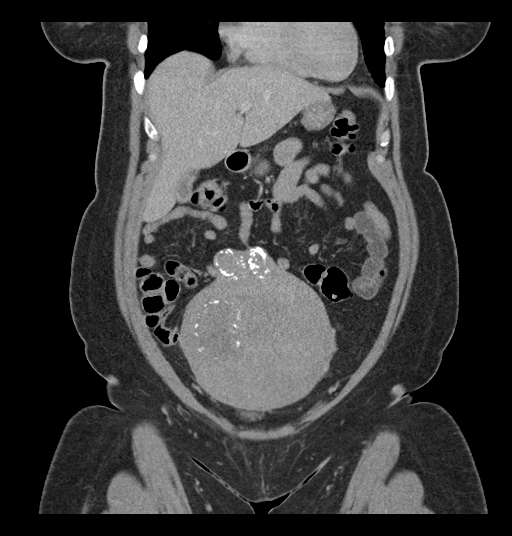
[im 42/94  soft-tissue]
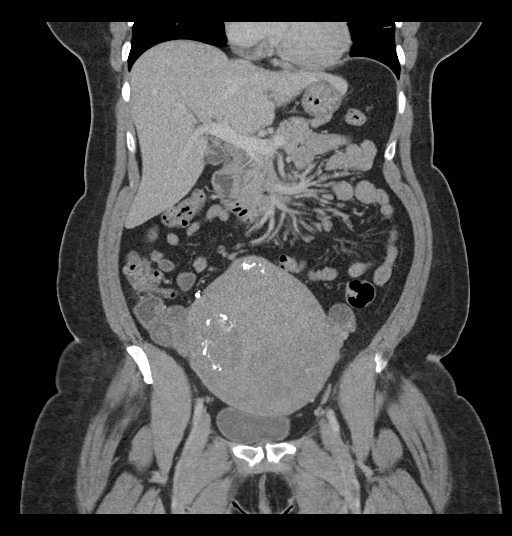
[im 52/94  soft-tissue]
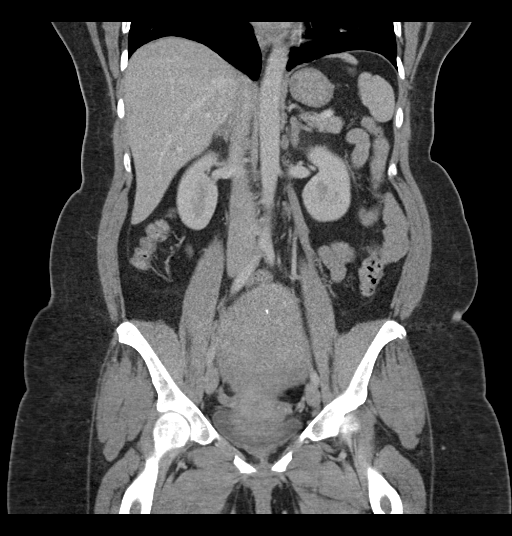

[17 of 46 positions shown; findings below may reference images not displayed]

FINDINGS: Lower chest: Linear atelectasis in the left base and right middle
lobe. No pleural effusion. Normal heart size.

Hepatobiliary: Gallstones. No focal hepatic abnormality or biliary
dilatation

Pancreas: Unremarkable. No pancreatic ductal dilatation or
surrounding inflammatory changes.

Spleen: Normal in size without focal abnormality.

Adrenals/Urinary Tract: Adrenal glands are unremarkable. Kidneys are
normal, without renal calculi, focal lesion, or hydronephrosis.
Bladder is unremarkable.

Stomach/Bowel: Stomach is within normal limits. Status post
appendectomy. No evidence of bowel wall thickening, distention, or
inflammatory changes.

Vascular/Lymphatic: No significant vascular findings are present. No
enlarged abdominal or pelvic lymph nodes.

Reproductive: Markedly enlarged uterus. Multiple enhancing masses,
some with calcification. No adnexal mass.

Other: Negative for free air or free fluid. Small fatty hernias in
the umbilical region

Musculoskeletal: No acute or significant osseous findings.
IMPRESSION: 1. No CT evidence for acute intra-abdominal or pelvic abnormality.
2. Gallstones
3. Markedly enlarged uterus with multiple masses, presumably
fibroids.

## 2019-06-27 IMAGING — CR DG CHEST 2V
2 series · 2 of 2 positions shown · non-contrast
Comparison: 06/23/2018

CLINICAL DATA: Short of breath

EXAM:
CHEST - 2 VIEW

[w chest pa]
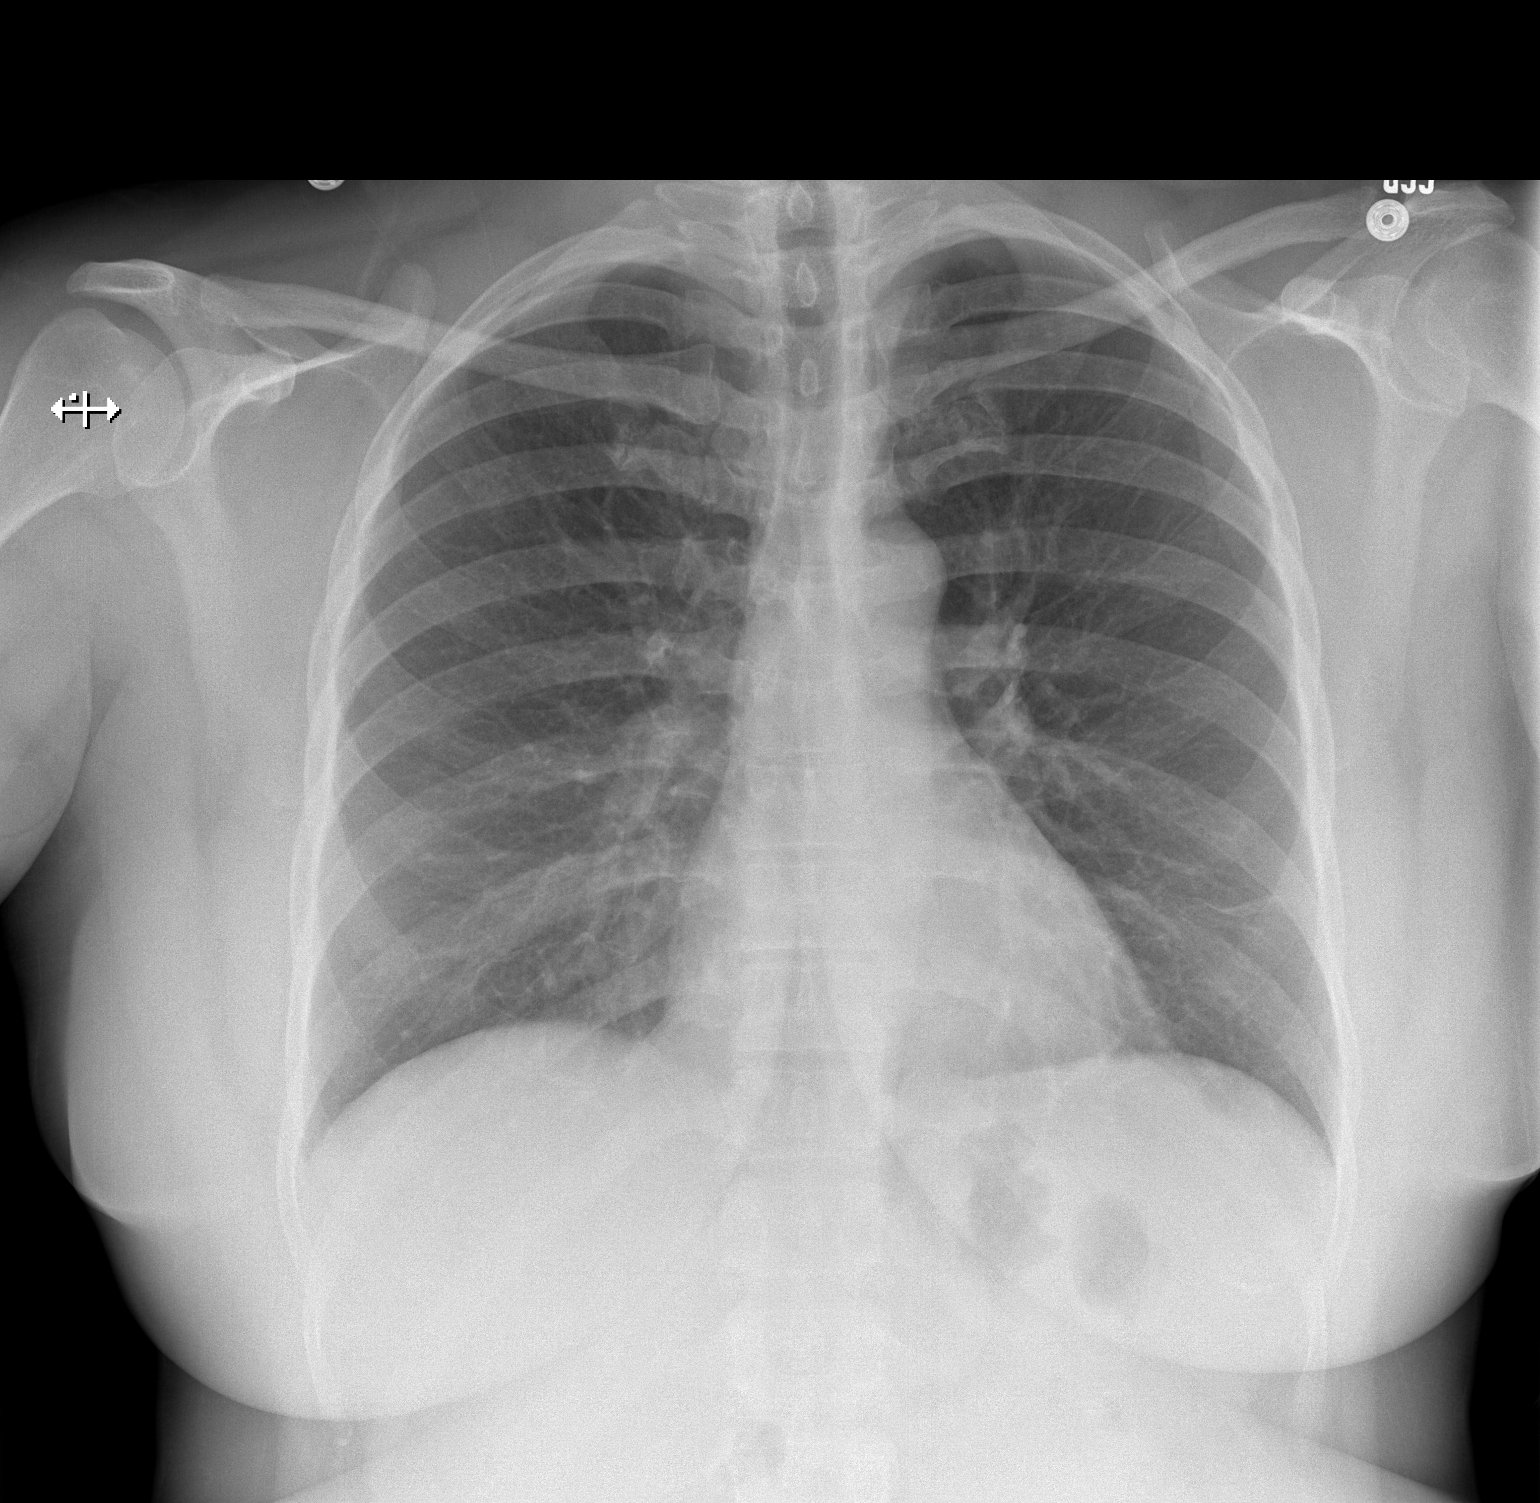

[w chest lat]
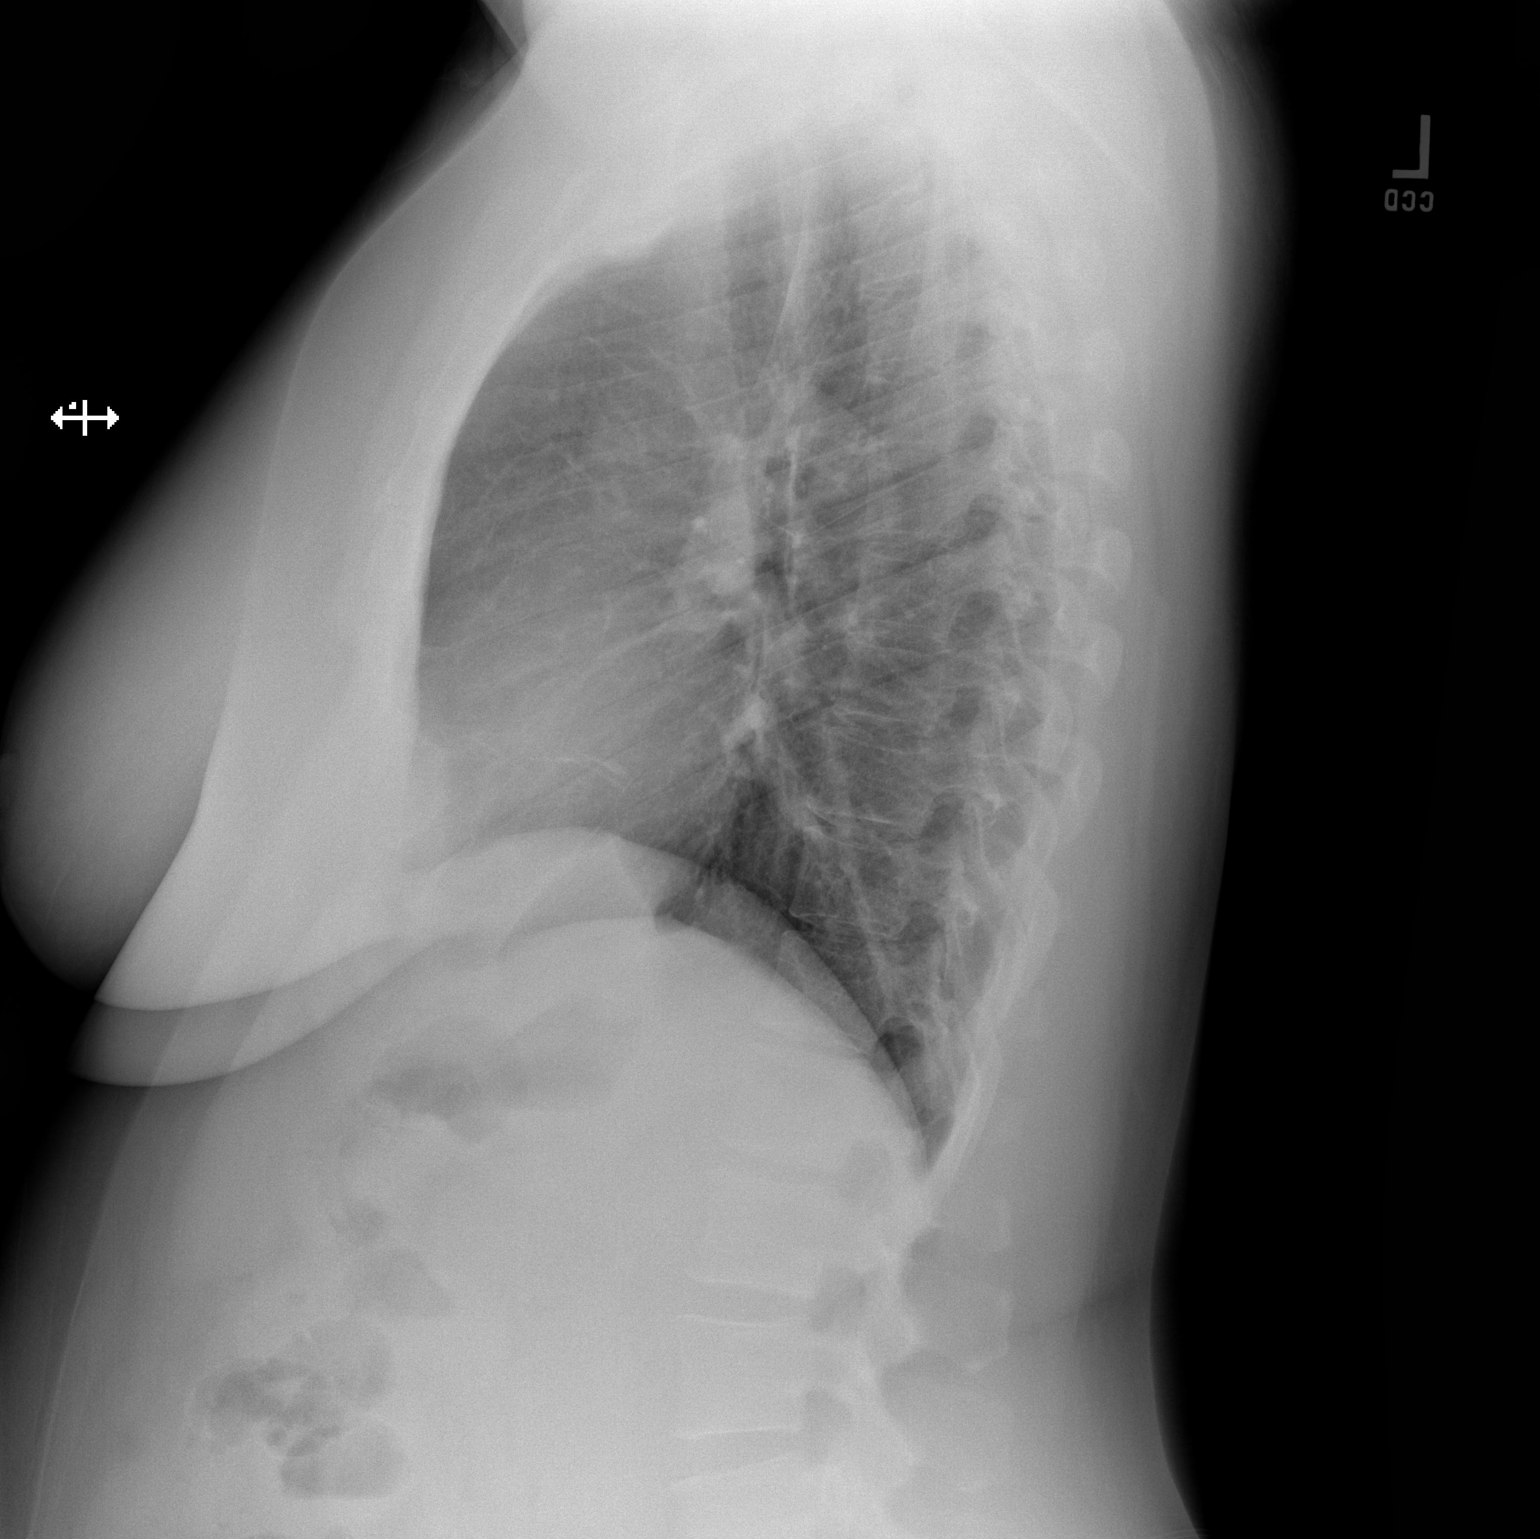

[2 of 2 positions shown; findings below may reference images not displayed]

FINDINGS: The heart size and mediastinal contours are within normal limits.
Both lungs are clear. The visualized skeletal structures are
unremarkable.
IMPRESSION: No active cardiopulmonary disease.

## 2019-09-12 ENCOUNTER — Other Ambulatory Visit: Payer: Self-pay

## 2019-09-12 ENCOUNTER — Emergency Department (HOSPITAL_COMMUNITY): Payer: Self-pay

## 2019-09-12 ENCOUNTER — Encounter (HOSPITAL_COMMUNITY): Payer: Self-pay | Admitting: *Deleted

## 2019-09-12 ENCOUNTER — Emergency Department (HOSPITAL_COMMUNITY)
Admission: EM | Admit: 2019-09-12 | Discharge: 2019-09-12 | Disposition: A | Discharge status: Home / Self Care | Payer: Self-pay | Attending: Emergency Medicine | Admitting: Emergency Medicine

## 2019-09-12 DIAGNOSIS — M25561 Pain in right knee: Secondary | ICD-10-CM | POA: Insufficient documentation

## 2019-09-12 DIAGNOSIS — R0789 Other chest pain: Secondary | ICD-10-CM

## 2019-09-12 DIAGNOSIS — Z79899 Other long term (current) drug therapy: Secondary | ICD-10-CM | POA: Insufficient documentation

## 2019-09-12 DIAGNOSIS — R0602 Shortness of breath: Secondary | ICD-10-CM | POA: Insufficient documentation

## 2019-09-12 DIAGNOSIS — I10 Essential (primary) hypertension: Secondary | ICD-10-CM | POA: Insufficient documentation

## 2019-09-12 DIAGNOSIS — K2100 Gastro-esophageal reflux disease with esophagitis, without bleeding: Secondary | ICD-10-CM

## 2019-09-12 HISTORY — DX: Essential (primary) hypertension: I10

## 2019-09-12 LAB — BASIC METABOLIC PANEL
Anion gap: 8 (ref 5–15)
BUN: 11 mg/dL (ref 6–20)
CO2: 25 mmol/L (ref 22–32)
Calcium: 9 mg/dL (ref 8.9–10.3)
Chloride: 104 mmol/L (ref 98–111)
Creatinine, Ser: 1.05 mg/dL — ABNORMAL HIGH (ref 0.44–1.00)
GFR calc Af Amer: >60 mL/min (ref >60)
GFR calc non Af Amer: >60 mL/min (ref >60)
Glucose, Bld: 112 mg/dL — ABNORMAL HIGH (ref 70–99)
Potassium: 3.5 mmol/L (ref 3.5–5.1)
Sodium: 137 mmol/L (ref 135–145)

## 2019-09-12 LAB — URINALYSIS, ROUTINE W REFLEX MICROSCOPIC
Bilirubin Urine: NEGATIVE
Glucose, UA: NEGATIVE mg/dL
Hgb urine dipstick: NEGATIVE
Ketones, ur: NEGATIVE mg/dL
Leukocytes,Ua: NEGATIVE
Nitrite: NEGATIVE
Protein, ur: NEGATIVE mg/dL
Specific Gravity, Urine: 1.014 (ref 1.005–1.030)
pH: 7 (ref 5.0–8.0)

## 2019-09-12 LAB — CBC
HCT: 31.4 % — ABNORMAL LOW (ref 36.0–46.0)
Hemoglobin: 9.1 g/dL — ABNORMAL LOW (ref 12.0–15.0)
MCH: 22.3 pg — ABNORMAL LOW (ref 26.0–34.0)
MCHC: 29 g/dL — ABNORMAL LOW (ref 30.0–36.0)
MCV: 77 fL — ABNORMAL LOW (ref 80.0–100.0)
Platelets: 370 10*3/uL (ref 150–400)
RBC: 4.08 MIL/uL (ref 3.87–5.11)
RDW: 15.8 % — ABNORMAL HIGH (ref 11.5–15.5)
WBC: 7.9 10*3/uL (ref 4.0–10.5)
nRBC: 0 % (ref 0.0–0.2)

## 2019-09-12 LAB — I-STAT BETA HCG BLOOD, ED (MC, WL, AP ONLY): I-stat hCG, quantitative: <5 m[IU]/mL (ref <5)

## 2019-09-12 LAB — HEPATIC FUNCTION PANEL
ALT: 15 U/L (ref 0–44)
AST: 19 U/L (ref 15–41)
Albumin: 3.7 g/dL (ref 3.5–5.0)
Alkaline Phosphatase: 47 U/L (ref 38–126)
Bilirubin, Direct: 0.1 mg/dL (ref 0.0–0.2)
Indirect Bilirubin: 0.6 mg/dL (ref 0.3–0.9)
Total Bilirubin: 0.7 mg/dL (ref 0.3–1.2)
Total Protein: 7.9 g/dL (ref 6.5–8.1)

## 2019-09-12 LAB — TROPONIN I (HIGH SENSITIVITY)
Troponin I (High Sensitivity): 2 ng/L (ref <18)
Troponin I (High Sensitivity): 4 ng/L (ref <18)

## 2019-09-12 LAB — LIPASE, BLOOD: Lipase: 27 U/L (ref 11–51)

## 2019-09-12 MED ORDER — LIDOCAINE VISCOUS HCL 2 % MT SOLN
15.0000 mL | Freq: Once | OROMUCOSAL | Status: AC
  Administered 2019-09-12: 18:00:00 15 mL via ORAL
  Filled 2019-09-12: qty 15

## 2019-09-12 MED ORDER — ONDANSETRON 4 MG PO TBDP: ORAL_TABLET | ORAL | 0 refills | Status: DC

## 2019-09-12 MED ORDER — MORPHINE SULFATE (PF) 4 MG/ML IV SOLN
4.0000 mg | Freq: Once | INTRAVENOUS | Status: AC
  Administered 2019-09-12: 4 mg via INTRAVENOUS
  Filled 2019-09-12: qty 1

## 2019-09-12 MED ORDER — SODIUM CHLORIDE 0.9% FLUSH: 3.0000 mL | Freq: Once | INTRAVENOUS | Status: DC

## 2019-09-12 MED ORDER — ONDANSETRON HCL 4 MG/2ML IJ SOLN
4.0000 mg | Freq: Once | INTRAMUSCULAR | Status: AC
  Administered 2019-09-12: 4 mg via INTRAVENOUS
  Filled 2019-09-12: qty 2

## 2019-09-12 MED ORDER — HYDROCODONE-ACETAMINOPHEN 5-325 MG PO TABS: 1.0000 | ORAL_TABLET | Freq: Four times a day (QID) | ORAL | 0 refills | Status: DC | PRN

## 2019-09-12 MED ORDER — PANTOPRAZOLE SODIUM 20 MG PO TBEC: 20.0000 mg | DELAYED_RELEASE_TABLET | Freq: Every day | ORAL | 0 refills | Status: DC

## 2019-09-12 MED ORDER — SUCRALFATE 1 G PO TABS: 1.0000 g | ORAL_TABLET | Freq: Three times a day (TID) | ORAL | 0 refills | Status: DC

## 2019-09-12 MED ORDER — ALUM & MAG HYDROXIDE-SIMETH 200-200-20 MG/5ML PO SUSP
30.0000 mL | Freq: Once | ORAL | Status: AC
  Administered 2019-09-12: 30 mL via ORAL
  Filled 2019-09-12: qty 30

## 2019-09-12 NOTE — ED Notes (Addendum)
Pt states she has been experiencing chest pain, shortness of breath and lower abdominal pain for approximately 1 week. She states the chest pain is midsternal , radiates to left breast and upper abdominal region. Pt states that the pain feels both sharp and heavy. Pt denies that anything makes the pain better or worse. Pt states she mostly notices after eating. Pt denies any nausea and vomiting. Pt denies any blood in bowel movements.Pt also states that she has been taking ibuprofen and naproxen for years. She has also been taking both to help alleviate the pain in her chest but it has been unsuccessful. Pt states that the abdominal pain is diffuse, it is tender upon palpation, Pt states she has a history of fibroids and has been unable to see the doctor due to lack of insurance after her job shut down due to covid. Pt states shortness of breath is typically with exacerbation. Pt also states she has history of HTN and has been taking medication for two months but admits that she has not been consistent with the medication. Pt also states that she finished her blood pressure medication yesterday and has not taken it today.

## 2019-09-12 NOTE — ED Provider Notes (Signed)
[facility] DEPT Provider Note   CSN: [id number] Arrival date & time: 09/12/19  1200     History   Chief Complaint Chief Complaint  Patient presents with  . Chest Pain  . Shortness of Breath  . Abdominal Pain    HPI Stefanie Moon is a 48 y.o. female.     HPI Patient presents with epigastric and left upper quadrant pain which radiates into her chest.  States is been going on for the past week.  Worse after eating.  Not associated with nausea or vomiting.  Denies grossly bloody or melanotic stool.  Patient also complains of lower abdominal pain for more chronic.  Patient has history of fibroids but has not seen OB/GYN.  Also states she is having a right knee swelling and pain which is gradually worsened within the last week.  States she is taking ibuprofen and naproxen.  No urinary complaints.  No vaginal bleeding. Past Medical History:  Diagnosis Date  . Blood transfusion without reported diagnosis   . Fibroids   . Hypertension     There are no active problems to display for this patient.   Past Surgical History:  Procedure Laterality Date  . APPENDECTOMY       OB History   No obstetric history on file.      Home Medications    Prior to Admission medications   Medication Sig Start Date End Date Taking? Authorizing Provider  acetaminophen (TYLENOL) 500 MG tablet Take 500 mg by mouth every 6 (six) hours as needed for mild pain or moderate pain.   Yes [provider]  lisinopril (ZESTRIL) 20 MG tablet Take 20 mg by mouth daily.   Yes [provider]  gabapentin (NEURONTIN) 300 MG capsule Take 1 capsule (300 mg total) by mouth 2 (two) times daily. Patient not taking: Reported on 09/01/2018 06/23/18   Drenda Freeze, MD  HYDROcodone-acetaminophen [facility]) 5-325 MG tablet Take 1 tablet by mouth every 6 (six) hours as needed for severe pain. 09/12/19   Julianne Rice, MD  naproxen (NAPROSYN) 500 MG tablet Take 1 tablet  (500 mg total) by mouth 2 (two) times daily with a meal. Patient not taking: Reported on 09/01/2018 02/04/18   Sherwood Gambler, MD  ondansetron (ZOFRAN ODT) 4 MG disintegrating tablet 4mg  ODT q4 hours prn nausea/vomit 09/12/19   Julianne Rice, MD  pantoprazole (PROTONIX) 20 MG tablet Take 1 tablet (20 mg total) by mouth daily. 09/12/19   Julianne Rice, MD  predniSONE (DELTASONE) 20 MG tablet Take 60 mg daily x 2 days then 40 mg daily x 2 days then 20 mg daily x 2 days Patient not taking: Reported on 09/01/2018 06/23/18   Drenda Freeze, MD  sucralfate (CARAFATE) 1 g tablet Take 1 tablet (1 g total) by mouth 4 (four) times daily -  with meals and at bedtime. 09/12/19   Julianne Rice, MD    Family History No family history on file.  Social History Social History   Tobacco Use  . Smoking status: Never Smoker  . Smokeless tobacco: Never Used  Substance Use Topics  . Alcohol use: Yes    Comment: socially   . Drug use: No     Allergies   Tramadol   Review of Systems Review of Systems  Constitutional: Negative for chills and fever.  HENT: Negative for trouble swallowing.   Eyes: Negative for visual disturbance.  Respiratory: Negative for cough and shortness of breath.   Cardiovascular: Positive for  chest pain. Negative for palpitations and leg swelling.  Gastrointestinal: Positive for abdominal pain. Negative for blood in stool, constipation, diarrhea, nausea and vomiting.  Genitourinary: Positive for pelvic pain. Negative for dysuria, flank pain and frequency.  Musculoskeletal: Positive for arthralgias and joint swelling. Negative for back pain and myalgias.  Skin: Negative for rash and wound.  Neurological: Negative for dizziness, weakness, light-headedness, numbness and headaches.  All other systems reviewed and are negative.    Physical Exam Updated Vital Signs BP (!) 158/84   Pulse 79   Temp 98.7 F (37.1 C) (Oral)   Resp 18   Ht 5\' 4"  (1.626 m)   Wt 98.4 kg    LMP 08/29/2019   SpO2 100%   BMI 37.25 kg/m   Physical Exam Vitals signs and nursing note reviewed.  Constitutional:      Appearance: Normal appearance. She is well-developed. She is not ill-appearing.  HENT:     Head: Normocephalic and atraumatic.     Nose: Nose normal.     Mouth/Throat:     Mouth: Mucous membranes are moist.  Eyes:     Extraocular Movements: Extraocular movements intact.     Pupils: Pupils are equal, round, and reactive to light.  Neck:     Musculoskeletal: Normal range of motion and neck supple. No neck rigidity or muscular tenderness.     Vascular: No carotid bruit.  Cardiovascular:     Rate and Rhythm: Normal rate and regular rhythm.     Heart sounds: No murmur. No friction rub. No gallop.   Pulmonary:     Effort: Pulmonary effort is normal. No respiratory distress.     Breath sounds: Normal breath sounds. No stridor. No wheezing, rhonchi or rales.     Comments: Central chest wall tenderness to palpation.  No crepitance or deformity. Chest:     Chest wall: Tenderness present.  Abdominal:     General: Bowel sounds are normal.     Palpations: Abdomen is soft.     Tenderness: There is abdominal tenderness. There is no right CVA tenderness, left CVA tenderness, guarding or rebound.     Comments: With mild diffuse abdominal tenderness but less pronounced in the epigastrium, left upper quadrants and suprapubic region.  There is some suprapubic fullness.  There is no rebound or guarding.  Musculoskeletal: Normal range of motion.        General: Tenderness present. No swelling, deformity or signs of injury.     Right lower leg: No edema.     Left lower leg: No edema.     Comments: Diffuse right knee tenderness to palpation.  Limited exam due to patient compliance.  No obvious deformity.  No ligamentous laxity.  Distal pulses are 2+.  No popliteal fossa fullness or calf tenderness.  Lymphadenopathy:     Cervical: No cervical adenopathy.  Skin:    General: Skin  is warm and dry.     Findings: No erythema or rash.  Neurological:     General: No focal deficit present.     Mental Status: She is alert and oriented to person, place, and time.     Comments: Moving all extremities without focal deficit.  Sensation intact.  Psychiatric:        Behavior: Behavior normal.      ED Treatments / Results  Labs (all labs ordered are listed, but only abnormal results are displayed) Labs Reviewed  BASIC METABOLIC PANEL - Abnormal; Notable for the following components:  Result Value   Glucose, Bld 112 (*)    Creatinine, Ser 1.05 (*)    All other components within normal limits  CBC - Abnormal; Notable for the following components:   Hemoglobin 9.1 (*)    HCT 31.4 (*)    MCV 77.0 (*)    MCH 22.3 (*)    MCHC 29.0 (*)    RDW 15.8 (*)    All other components within normal limits  HEPATIC FUNCTION PANEL  LIPASE, BLOOD  URINALYSIS, ROUTINE W REFLEX MICROSCOPIC  I-STAT BETA HCG BLOOD, ED ([facility], [facility], [facility] ONLY)  TROPONIN I (HIGH SENSITIVITY)  TROPONIN I (HIGH SENSITIVITY)    EKG EKG Interpretation  Date/Time:  Tuesday September 12 2019 12:26:01 EDT Ventricular Rate:  86 PR Interval:    QRS Duration: 71 QT Interval:  357 QTC Calculation: 427 R Axis:   8 Text Interpretation:  Sinus rhythm Baseline wander in lead(s) V6 Confirmed by Julianne Rice [phone] on 09/12/2019 4:18:56 PM   Radiology Dg Chest 2 View  Result Date: 09/12/2019 CLINICAL DATA:  Shortness of breath EXAM: CHEST - 2 VIEW COMPARISON:  June 23, 2018 FINDINGS: There is mild scarring in the left base. The lungs elsewhere are clear. Heart size and pulmonary vascularity are normal. No adenopathy. No bone lesions. IMPRESSION: Mild scarring left base. No edema or consolidation. Cardiac silhouette within normal limits. Electronically Signed   By: Lowella Grip III M.D.   On: 09/12/2019 13:33   Dg Knee Complete 4 Views Right  Result Date: 09/12/2019 CLINICAL DATA:  Pt states  intermittent/chronic right knee pain without injury - states at times she can not bear weight and will cause her to fall - feels thought it just " gives way" EXAM: RIGHT KNEE - COMPLETE 4+ VIEW COMPARISON:  None. FINDINGS: No evidence of fracture, dislocation, or joint effusion. Minimal degenerative change. No focal bony abnormality. Soft tissues are unremarkable. IMPRESSION: No acute osseous abnormality in the right knee. Electronically Signed   By: Audie Pinto M.D.   On: 09/12/2019 17:04    Procedures Procedures (including critical care time)  Medications Ordered in ED Medications  sodium chloride flush (NS) 0.9 % injection 3 mL (has no administration in time range)  ondansetron (ZOFRAN) injection 4 mg (4 mg Intravenous Given 09/12/19 1823)  morphine 4 MG/ML injection 4 mg (4 mg Intravenous Given 09/12/19 1823)  alum & mag hydroxide-simeth (MAALOX/MYLANTA) 200-200-20 MG/5ML suspension 30 mL (30 mLs Oral Given 09/12/19 1823)    And  lidocaine (XYLOCAINE) 2 % viscous mouth solution 15 mL (15 mLs Oral Given 09/12/19 1823)     Initial Impression / Assessment and Plan / ED Course  I have reviewed the triage vital signs and the nursing notes.  Pertinent labs & imaging results that were available during my care of the patient were reviewed by me and considered in my medical decision making (see chart for details).        Patient appears to have multiple different things going on.  Has right knee pain which sounds like it may be related to meniscal tear.  States that it was giving out for several months prior to pain starting.  X-ray without acute findings.  Will place in knee sleeve and have follow-up with orthopedics.  Patient also states she has been taking increased amount of NSAIDs including naproxen, ibuprofen and Goody powders.  This likely is induced a gastritis and reflux with esophagitis.  Symptoms improved with GI cocktail.  Low suspicion for coronary artery  disease/PE.  Advised  avoiding all NSAIDs and will start on PPI and Carafate.  Patient understands the need to follow-up with gastroenterology.  Strict return precautions have been given.  Final Clinical Impressions(s) / ED Diagnoses   Final diagnoses:  GERD with esophagitis  Right knee pain, unspecified chronicity  Atypical chest pain    ED Discharge Orders         Ordered    pantoprazole (PROTONIX) 20 MG tablet  Daily     09/12/19 1850    sucralfate (CARAFATE) 1 g tablet  3 times daily with meals & bedtime     09/12/19 1850    ondansetron (ZOFRAN ODT) 4 MG disintegrating tablet     09/12/19 1850    HYDROcodone-acetaminophen (NORCO) 5-325 MG tablet  Every 6 hours PRN     09/12/19 1850           Julianne Rice, MD 09/12/19 [phone]

## 2019-09-12 NOTE — Progress Notes (Signed)
Orthopedic Tech Progress Note Patient Details:  Stefanie Moon 12-30-1970 [id number] Pt already has walker at home and she said she will rather use that instead of crutches. Ortho Devices Type of Ortho Device: Knee Sleeve Ortho Device/Splint Location: Rt knee Ortho Device/Splint Interventions: Application   Post Interventions Patient Tolerated: Well Instructions Provided: Adjustment of device, Care of device   Ladell [facility] 09/12/2019, 6:41 PM

## 2019-09-12 NOTE — ED Triage Notes (Signed)
Pt states legs and feet started to swell about a week ago, intermittent chest pain, shob, abd pain, has slept in recliner the last couple of nights due to chest pain.

## 2019-09-12 NOTE — Discharge Instructions (Addendum)
Avoid all NSAIDs including naproxen, ibuprofen and Goody powders.  If you are still having upper abdominal pain despite starting Protonix and Carafate, call and make appointment to follow-up with a gastroenterologist.

## 2020-07-18 ENCOUNTER — Emergency Department (HOSPITAL_COMMUNITY): Admission: EM | Admit: 2020-07-18 | Discharge: 2020-07-18 | Discharge status: Against Medical Advice | Payer: 59

## 2020-07-18 ENCOUNTER — Other Ambulatory Visit: Payer: Self-pay

## 2020-07-19 ENCOUNTER — Other Ambulatory Visit: Payer: Self-pay

## 2020-07-19 ENCOUNTER — Ambulatory Visit
Admission: EM | Admit: 2020-07-19 | Discharge: 2020-07-19 | Disposition: A | Discharge status: Home / Self Care | Payer: 59 | Attending: Physician Assistant | Admitting: Physician Assistant

## 2020-07-19 ENCOUNTER — Ambulatory Visit: Payer: Self-pay

## 2020-07-19 DIAGNOSIS — N939 Abnormal uterine and vaginal bleeding, unspecified: Secondary | ICD-10-CM

## 2020-07-19 DIAGNOSIS — I10 Essential (primary) hypertension: Secondary | ICD-10-CM

## 2020-07-19 DIAGNOSIS — R102 Pelvic and perineal pain: Secondary | ICD-10-CM | POA: Diagnosis not present

## 2020-07-19 MED ORDER — IBUPROFEN 800 MG PO TABS: 800.0000 mg | ORAL_TABLET | Freq: Three times a day (TID) | ORAL | 0 refills | Status: DC

## 2020-07-19 MED ORDER — AMLODIPINE BESYLATE 5 MG PO TABS: 5.0000 mg | ORAL_TABLET | Freq: Every day | ORAL | 0 refills | Status: DC

## 2020-07-19 MED ORDER — TRAMADOL HCL 50 MG PO TABS: 50.0000 mg | ORAL_TABLET | Freq: Three times a day (TID) | ORAL | 0 refills | Status: DC | PRN

## 2020-07-19 NOTE — ED Triage Notes (Signed)
Pt states had a biopsy from fibroid 3wks ago and having vaginal bleeding and severe pain since. States went to ED 2 days ago d/t heavy bleeding and pain, states it was a 6-8hr wait and she bleeding so bad she couldn't sit in waiting area. States her surgeon told her to come here for her hypertension and pain. States they stopped her megace and started her on 2 different meds. States bleeding has got better but her pain is severe.

## 2020-07-19 NOTE — Discharge Instructions (Addendum)
Abdominal pain/vaginal bleeding Please call your surgeon for follow up. Take ibuprofen 800mg  three times a day, tylenol 1000mg  three times a day. Can add tramadol sparingly if needed. If worsening symptoms, go to the emergency department for further evaluation.  Hypertension Start amlodipine as directed. Get BP cuff and check BP twice daily, keep a log. Follow up with PCP as scheduled for further evaluation and management needed.

## 2020-07-19 NOTE — ED Provider Notes (Addendum)
[facility]    CSN: [id number] Arrival date & time: 07/19/20  1401      History   Chief Complaint Chief Complaint  Patient presents with  . Abdominal Pain    HPI Stefanie Moon is a 49 y.o. female.   49 year old female with history of fibroids, hypertension comes in for suprapubic abdominal pain and hypertension.  Patient has known fibroids that was controlled by Megace.  Had biopsy 3 weeks ago, and has since then had vaginal bleeding and severe suprapubic abdominal cramping.  States due to the bleeding and cramping, went to the ED, but left prior to triage due to wait time.  At the time, BP was found to be 767H systolic.  She contacted her surgeon's office, and Megace was stopped, and put on 2 different medications, which has improved the bleeding.  However, this has not improved the pain.  And was told to come here for evaluation.  She has tried ibuprofen, naproxen, Tylenol without relief.  Patient was diagnosed with hypertension by an urgent care 1 to 2 years ago.  At that time, was put on lisinopril.  Did not have any side effects to this medicine, but is hesitant to restart it due to side effects she has read.  She has not tried other medications in the past.  Has not had hypertension managed by primary care before. No chest pain, shob, one sided weakness.     Past Medical History:  Diagnosis Date  . Blood transfusion without reported diagnosis   . Fibroids   . Hypertension     There are no problems to display for this patient.   Past Surgical History:  Procedure Laterality Date  . APPENDECTOMY      OB History   No obstetric history on file.      Home Medications    Prior to Admission medications   Medication Sig Start Date End Date Taking? Authorizing Provider  acetaminophen (TYLENOL) 500 MG tablet Take 500 mg by mouth every 6 (six) hours as needed for mild pain or moderate pain.    [provider]  amLODipine (NORVASC) 5 MG tablet  Take 1 tablet (5 mg total) by mouth daily. 07/19/20   Tasia Catchings, Mailey Landstrom V, PA-C  ibuprofen (ADVIL) 800 MG tablet Take 1 tablet (800 mg total) by mouth 3 (three) times daily. 07/19/20   Tasia Catchings, Clemente Dewey V, PA-C  lisinopril (ZESTRIL) 20 MG tablet Take 20 mg by mouth daily.    [provider]  traMADol (ULTRAM) 50 MG tablet Take 1 tablet (50 mg total) by mouth every 8 (eight) hours as needed. 07/19/20   Ok Edwards, PA-C    Family History History reviewed. No pertinent family history.  Social History Social History   Tobacco Use  . Smoking status: Never Smoker  . Smokeless tobacco: Never Used  Vaping Use  . Vaping Use: Never used  Substance Use Topics  . Alcohol use: Yes    Comment: socially   . Drug use: No     Allergies   Tramadol   Review of Systems Review of Systems  Reason unable to perform ROS: See HPI as above.     Physical Exam Triage Vital Signs ED Triage Vitals  Enc Vitals Group     BP 07/19/20 1445 (!) 191/104     Pulse Rate 07/19/20 1445 101     Resp 07/19/20 1445 18     Temp 07/19/20 1445 98.7 F (37.1 C)     Temp Source  07/19/20 1445 Oral     SpO2 07/19/20 1445 98 %     Weight --      Height --      Head Circumference --      Peak Flow --      Pain Score 07/19/20 1446 8     Pain Loc --      Pain Edu? --      Excl. in [place]? --    No data found.  Updated Vital Signs BP (!) 191/104 (BP Location: Left Arm)   Pulse 101   Temp 98.7 F (37.1 C) (Oral)   Resp 18   SpO2 98%   Physical Exam Constitutional:      General: She is not in acute distress.    Appearance: She is well-developed. She is not ill-appearing, toxic-appearing or diaphoretic.  HENT:     Head: Normocephalic and atraumatic.  Eyes:     Conjunctiva/sclera: Conjunctivae normal.     Pupils: Pupils are equal, round, and reactive to light.  Cardiovascular:     Rate and Rhythm: Normal rate and regular rhythm.  Pulmonary:     Effort: Pulmonary effort is normal. No respiratory distress.      Comments: LCTAB Abdominal:     General: Bowel sounds are normal.     Palpations: Abdomen is soft.     Tenderness: There is abdominal tenderness in the suprapubic area. There is no right CVA tenderness, left CVA tenderness, guarding or rebound.  Musculoskeletal:     Cervical back: Normal range of motion and neck supple.  Skin:    General: Skin is warm and dry.  Neurological:     Mental Status: She is alert and oriented to person, place, and time.  Psychiatric:        Behavior: Behavior normal.        Judgment: Judgment normal.      UC Treatments / Results  Labs (all labs ordered are listed, but only abnormal results are displayed) Labs Reviewed - No data to display  EKG   Radiology No results found.  Procedures Procedures (including critical care time)  Medications Ordered in UC Medications - No data to display  Initial Impression / Assessment and Plan / UC Course  I have reviewed the triage vital signs and the nursing notes.  Pertinent labs & imaging results that were available during my care of the patient were reviewed by me and considered in my medical decision making (see chart for details).     1. Suprapubic pain/vaginal bleeding Abdomen soft, +BS, suprapubic tenderness, but without guarding or rebound.  For now we will treat symptomatically with NSAIDs, Tylenol, and use tramadol sparingly.  Discussed to follow-up with surgeon for further evaluation given symptoms occurred after biopsy.  Strict return precautions given.  Patient with documented history of allergy to tramadol. Took tramadol 08/2019 without allergic reaction.   2. Hypertension  Patient with PCP establish visit in a few weeks.  Will start amlodipine at this time.  Patient to keep BP logs for PCP follow-up.  Return precautions given.  Patient expresses understanding and agrees to plan.  Final Clinical Impressions(s) / UC Diagnoses   Final diagnoses:  Suprapubic pain  Vaginal bleeding  Essential  hypertension    ED Prescriptions    Medication Sig Dispense Auth. Provider   ibuprofen (ADVIL) 800 MG tablet Take 1 tablet (800 mg total) by mouth 3 (three) times daily. 21 tablet Nomi Rudnicki V, PA-C   amLODipine (NORVASC) 5 MG tablet  Take 1 tablet (5 mg total) by mouth daily. 30 tablet Khyler Urda V, PA-C   traMADol (ULTRAM) 50 MG tablet Take 1 tablet (50 mg total) by mouth every 8 (eight) hours as needed. 10 tablet Ok Edwards, PA-C     I have reviewed the PDMP during this encounter.   Ok Edwards, PA-C 07/19/20 1627    Ok Edwards, PA-C 07/19/20 [phone]

## 2020-07-19 NOTE — Telephone Encounter (Signed)
Patient called in tears stating that she has a biopsy if a fibroid in prep for surgery. Since having the biopsy almost 3 weeks ago she has had heavy bleeding and cramping.  She states that Wednesday ( per chart yesterday) she called 911 and went to the ER for her severe bleeding and pain.  She states that her BP was 230/130 from the pain.  She was made to sit in the waiting room so she left without care.  Since she has called the physician that did the biopsy but they have not been any help.  Today she states tat she is cramping severely and has severe vaginal bleeding.  She states she has passed several large clots.  She has headache and feels tired and weak. Per protocol patient will return to ER. She was advised not to drive and to call [phone] if needed. She refused NT assistance for 911 call. Care advice was read to patient.  She verbalized understanding  Reason for Disposition . SEVERE vaginal bleeding (e.g., soaking 2 pads or tampons per hour and present 2 or more hours; 1 menstrual cup every 2 hours) . [2] Systolic BP  >= 440 OR Diastolic >= 102 AND [7] cardiac or neurologic symptoms (e.g., chest pain, difficulty breathing, unsteady gait, blurred vision)  Answer Assessment - Initial Assessment Questions 1. AMOUNT: "Describe the bleeding that you are having."    - SPOTTING: spotting, or pinkish / brownish mucous discharge; does not fill panti-liner or pad    - MILD:  less than 1 pad / hour; less than patient's usual menstrual bleeding   - MODERATE: 1-2 pads / hour; 1 menstrual cup every 6 hours; small-medium blood clots (e.g., pea, grape, small coin)   - SEVERE: soaking 2 or more pads/hour for 2 or more hours; 1 menstrual cup every 2 hours; bleeding not contained by pads or continuous red blood from vagina; large blood clots (e.g., golf ball, large coin)      severe 2. ONSET: "When did the bleeding begin?" "Is it continuing now?"     Last week after biopsy fibriods surgery sept 29th 3. MENSTRUAL  PERIOD: "When was the last normal menstrual period?" "How is this different than your period?"     period 4. REGULARITY: "How regular are your periods?"     Irregular timing 5. ABDOMINAL PAIN: "Do you have any pain?" "How bad is the pain?"  (e.g., Scale 1-10; mild, moderate, or severe)   - MILD (1-3): doesn't interfere with normal activities, abdomen soft and not tender to touch    - MODERATE (4-7): interferes with normal activities or awakens from sleep, tender to touch    - SEVERE (8-10): excruciating pain, doubled over, unable to do any normal activities      Severe lower abdomin 6. PREGNANCY: "Could you be pregnant?" "Are you sexually active?" "Did you recently give birth?"     no 7. BREASTFEEDING: "Are you breastfeeding?"    no 8. HORMONES: "Are you taking any hormone medications, prescription or OTC?" (e.g., birth control pills, estrogen)    no 9. BLOOD THINNERS: "Do you take any blood thinners?" (e.g., Coumadin/warfarin, Pradaxa/dabigatran, aspirin)     no 10. CAUSE: "What do you think is causing the bleeding?" (e.g., recent gyn surgery, recent gyn procedure; known bleeding disorder, cervical cancer, polycystic ovarian disease, fibroids)         fibroids 11. HEMODYNAMIC STATUS: "Are you weak or feeling lightheaded?" If Yes, ask: "Can you stand and walk normally?"  Feels tired 12. OTHER SYMPTOMS: "What other symptoms are you having with the bleeding?" (e.g., passed tissue, vaginal discharge, fever, menstrual-type cramps)       Clots large  Answer Assessment - Initial Assessment Questions 1. BLOOD PRESSURE: "What is the blood pressure?" "Did you take at least two measurements 5 minutes apart?"     No way to take it 2. ONSET: "When did you take your blood pressure?"     Ems came 230/130 2 days ago 3. HOW: "How did you obtain the blood pressure?" (e.g., visiting nurse, automatic home BP monitor)     automtic 4. HISTORY: "Do you have a history of high blood pressure?"     Yes  were on medication 5. MEDICATIONS: "Are you taking any medications for blood pressure?" "Have you missed any doses recently?"     Nothing 6. OTHER SYMPTOMS: "Do you have any symptoms?" (e.g., headache, chest pain, blurred vision, difficulty breathing, weakness)     Headache feels tired 7. PREGNANCY: "Is there any chance you are pregnant?" "When was your last menstrual period?"   N/A  Protocols used: VAGINAL BLEEDING - ABNORMAL-A-AH, BLOOD PRESSURE - HIGH-A-AH

## 2020-08-24 ENCOUNTER — Other Ambulatory Visit (HOSPITAL_COMMUNITY): Payer: 59

## 2020-08-27 NOTE — Progress Notes (Signed)
[place], [place] [address] Phone: [phone] Fax: [phone]      Your procedure is scheduled on Tuesday, Semptember 7, 2021.  Report to [facility] Main Entrance "A" at 5:30 A.M., and check in at the Admitting office.  Call this number if you have problems the morning of surgery:  [phone]  Call [phone] if you have any questions prior to your surgery date Monday-Friday 8am-4pm    Remember:  Do not eat after midnight the night before your surgery  You may drink clear liquids until 4:30 AM the morning of your surgery.   Clear liquids allowed are: Water, Non-Citrus Juices (without pulp), Carbonated Beverages, Clear Tea, Black Coffee Only, and Gatorade    Take these medicines the morning of surgery with A SIP OF WATER:  HEATHER 0.35 MG  As of today, STOP taking any Aspirin (unless otherwise instructed by your surgeon) Aleve, Naproxen, Ibuprofen, Motrin, Advil, Goody's, BC's, all herbal medications, fish oil, and all vitamins.                      Do not wear jewelry, make up, or nail polish.            Do not wear lotions, powders, perfumes, or deodorant.            Do not shave 48 hours prior to surgery.            Do not bring valuables to the hospital.            [facility] is not responsible for any belongings or valuables.  Do NOT Smoke (Tobacco/Vaping) or drink Alcohol 24 hours prior to your procedure If you use a CPAP at night, you may bring all equipment for your overnight stay.   Contacts, glasses, dentures or bridgework may not be worn into surgery.      For patients admitted to the hospital, discharge time will be determined by your treatment team.   Patients discharged the day of surgery will not be allowed to drive home, and someone needs to stay with them for 24 hours.    Special instructions:   [facility]- Preparing For Surgery  Before surgery, you  can play an important role. Because skin is not sterile, your skin needs to be as free of germs as possible. You can reduce the number of germs on your skin by washing with CHG (chlorahexidine gluconate) Soap before surgery.  CHG is an antiseptic cleaner which kills germs and bonds with the skin to continue killing germs even after washing.    Oral Hygiene is also important to reduce your risk of infection.  Remember - BRUSH YOUR TEETH THE MORNING OF SURGERY WITH YOUR REGULAR TOOTHPASTE  Please do not use if you have an allergy to CHG or antibacterial soaps. If your skin becomes reddened/irritated stop using the CHG.  Do not shave (including legs and underarms) for at least 48 hours prior to first CHG shower. It is OK to shave your face.  Please follow these instructions carefully.   1. Shower the NIGHT BEFORE SURGERY and the MORNING OF SURGERY with CHG Soap.   2. If you chose to wash your hair, wash your hair first as usual with your normal shampoo.  3. After you shampoo, rinse your hair and body thoroughly to remove the shampoo.  4. Use CHG as you would any other liquid soap. You  can apply CHG directly to the skin and wash gently with a scrungie or a clean washcloth.   5. Apply the CHG Soap to your body ONLY FROM THE NECK DOWN.  Do not use on open wounds or open sores. Avoid contact with your eyes, ears, mouth and genitals (private parts). Wash Face and genitals (private parts)  with your normal soap.   6. Wash thoroughly, paying special attention to the area where your surgery will be performed.  7. Thoroughly rinse your body with warm water from the neck down.  8. DO NOT shower/wash with your normal soap after using and rinsing off the CHG Soap.  9. Pat yourself dry with a CLEAN TOWEL.  10. Wear CLEAN PAJAMAS to bed the night before surgery  11. Place CLEAN SHEETS on your bed the night of your first shower and DO NOT SLEEP WITH PETS.   Day of Surgery: Wear Clean/Comfortable  clothing the morning of surgery Do not apply any deodorants/lotions.   Remember to brush your teeth WITH YOUR REGULAR TOOTHPASTE.   Please read over the following fact sheets that you were given.

## 2020-08-28 ENCOUNTER — Encounter (HOSPITAL_COMMUNITY): Payer: Self-pay

## 2020-08-28 ENCOUNTER — Encounter (HOSPITAL_COMMUNITY)
Admission: RE | Admit: 2020-08-28 | Discharge: 2020-08-28 | Disposition: A | Discharge status: Home / Self Care | Payer: 59 | Source: Ambulatory Visit | Attending: Obstetrics and Gynecology | Admitting: Obstetrics and Gynecology

## 2020-08-28 ENCOUNTER — Other Ambulatory Visit: Payer: Self-pay

## 2020-08-28 DIAGNOSIS — Z01812 Encounter for preprocedural laboratory examination: Secondary | ICD-10-CM | POA: Insufficient documentation

## 2020-08-28 HISTORY — DX: Unspecified osteoarthritis, unspecified site: M19.90

## 2020-08-28 HISTORY — DX: Anemia, unspecified: D64.9

## 2020-08-28 HISTORY — DX: Gastro-esophageal reflux disease without esophagitis: K21.9

## 2020-08-28 LAB — COMPREHENSIVE METABOLIC PANEL
ALT: 17 U/L (ref 0–44)
AST: 19 U/L (ref 15–41)
Albumin: 3.8 g/dL (ref 3.5–5.0)
Alkaline Phosphatase: 45 U/L (ref 38–126)
Anion gap: 7 (ref 5–15)
BUN: 10 mg/dL (ref 6–20)
CO2: 26 mmol/L (ref 22–32)
Calcium: 9.3 mg/dL (ref 8.9–10.3)
Chloride: 103 mmol/L (ref 98–111)
Creatinine, Ser: 1.09 mg/dL — ABNORMAL HIGH (ref 0.44–1.00)
GFR calc Af Amer: >60 mL/min (ref >60)
GFR calc non Af Amer: 60 mL/min — ABNORMAL LOW (ref >60)
Glucose, Bld: 99 mg/dL (ref 70–99)
Potassium: 3.7 mmol/L (ref 3.5–5.1)
Sodium: 136 mmol/L (ref 135–145)
Total Bilirubin: 0.9 mg/dL (ref 0.3–1.2)
Total Protein: 7.9 g/dL (ref 6.5–8.1)

## 2020-08-28 LAB — CBC
HCT: 33.4 % — ABNORMAL LOW (ref 36.0–46.0)
Hemoglobin: 9.3 g/dL — ABNORMAL LOW (ref 12.0–15.0)
MCH: 21.4 pg — ABNORMAL LOW (ref 26.0–34.0)
MCHC: 27.8 g/dL — ABNORMAL LOW (ref 30.0–36.0)
MCV: 77 fL — ABNORMAL LOW (ref 80.0–100.0)
Platelets: 391 10*3/uL (ref 150–400)
RBC: 4.34 MIL/uL (ref 3.87–5.11)
RDW: 17.6 % — ABNORMAL HIGH (ref 11.5–15.5)
WBC: 8.8 10*3/uL (ref 4.0–10.5)
nRBC: 0 % (ref 0.0–0.2)

## 2020-08-28 NOTE — Progress Notes (Signed)
PCP - [facility] [place] - n/a  Chest x-ray - 09/12/19 (2V) EKG - 09/13/19 Stress Test - n/a ECHO - n/a Cardiac Cath - n/a  ERAS: Clears til 4:30 am DOS, no drink  Coronavirus Screening Covid test scheduled on 08/31/20 Do you have any of the following symptoms:  Cough yes/no: No Fever (>100.50F)  yes/no: No Runny nose yes/no: No Sore throat yes/no: No Difficulty breathing/shortness of breath  yes/no: No  Have you traveled in the last 14 days and where? yes/no: No  Patient verbalized understanding of instructions that were given to them at the PAT appointment.

## 2020-08-31 ENCOUNTER — Other Ambulatory Visit (HOSPITAL_COMMUNITY)
Admission: RE | Admit: 2020-08-31 | Discharge: 2020-08-31 | Disposition: A | Discharge status: Home / Self Care | Payer: 59 | Source: Ambulatory Visit | Attending: Obstetrics and Gynecology | Admitting: Obstetrics and Gynecology

## 2020-08-31 ENCOUNTER — Other Ambulatory Visit (HOSPITAL_COMMUNITY): Payer: 59

## 2020-08-31 DIAGNOSIS — Z01812 Encounter for preprocedural laboratory examination: Secondary | ICD-10-CM | POA: Insufficient documentation

## 2020-08-31 DIAGNOSIS — Z20822 Contact with and (suspected) exposure to covid-19: Secondary | ICD-10-CM | POA: Diagnosis not present

## 2020-08-31 LAB — SARS CORONAVIRUS 2 (TAT 6-24 HRS): SARS Coronavirus 2: NEGATIVE

## 2020-09-02 NOTE — Anesthesia Preprocedure Evaluation (Addendum)
Anesthesia Evaluation  Patient identified by MRN, date of birth, ID band Patient awake    Reviewed: Allergy & Precautions, NPO status , Patient's Chart, lab work & pertinent test results  History of Anesthesia Complications Negative for: history of anesthetic complications  Airway Mallampati: II  TM Distance: >3 FB Neck ROM: Full    Dental no notable dental hx. (+) Dental Advisory Given   Pulmonary neg pulmonary ROS,    Pulmonary exam normal        Cardiovascular hypertension, Pt. on medications Normal cardiovascular exam     Neuro/Psych negative neurological ROS     GI/Hepatic Neg liver ROS, GERD  Medicated,  Endo/Other  negative endocrine ROSobese  Renal/GU negative Renal ROS     Musculoskeletal negative musculoskeletal ROS (+)   Abdominal   Peds  Hematology negative hematology ROS (+)   Anesthesia Other Findings   Reproductive/Obstetrics                            Anesthesia Physical Anesthesia Plan  ASA: II  Anesthesia Plan: General   Post-op Pain Management:  Regional for Post-op pain   Induction: Intravenous  PONV Risk Score and Plan: 4 or greater and Ondansetron, Dexamethasone, Midazolam and Scopolamine patch - Pre-op  Airway Management Planned: Oral ETT  Additional Equipment:   Intra-op Plan:   Post-operative Plan: Extubation in OR  Informed Consent: I have reviewed the patients History and Physical, chart, labs and discussed the procedure including the risks, benefits and alternatives for the proposed anesthesia with the patient or authorized representative who has indicated his/her understanding and acceptance.     Dental advisory given  Plan Discussed with: Anesthesiologist and CRNA  Anesthesia Plan Comments:        Anesthesia Quick Evaluation

## 2020-09-02 NOTE — H&P (Signed)
Stefanie Moon is an 49 y.o. female. Having heavy, painful menses, nothing she has taken so far has really helped with pain. Benign pipelle in June, u/s in May with several myomas 4-7 cm, uterus 20 weeks size.  We have tried progesterone to reduce bleeding, nothing has helped.  Hgb down to 9.6 with continued bleeding.  She desires definitive surgical treatment.  Pertinent Gynecological History Last pap: normal Date: 04/2020 OB History: G3, P3003 SVD x 3   Menstrual History: No LMP recorded (lmp unknown). (Menstrual status: Irregular Periods).    Past Medical History:  Diagnosis Date  . Anemia   . Arthritis    knee  . Blood transfusion without reported diagnosis   . Fibroids   . GERD (gastroesophageal reflux disease)   . Hypertension     Past Surgical History:  Procedure Laterality Date  . APPENDECTOMY      No family history on file.  Social History:  reports that she has never smoked. She has never used smokeless tobacco. She reports current alcohol use. She reports that she does not use drugs.  Allergies:  Allergies  Allergen Reactions  . Tramadol Hives and Rash    No medications prior to admission.    Review of Systems  Respiratory: Negative.   Cardiovascular: Negative.     There were no vitals taken for this visit. Physical Exam Constitutional:      Appearance: Normal appearance.  Cardiovascular:     Rate and Rhythm: Normal rate and regular rhythm.     Heart sounds: Normal heart sounds. No murmur heard.   Pulmonary:     Effort: Pulmonary effort is normal. No respiratory distress.     Breath sounds: Normal breath sounds.  Abdominal:     General: There is no distension.     Palpations: Abdomen is soft.     Comments: Uterine fundus at U-0, slightly tender  Genitourinary:    General: Normal vulva.     Comments: Uterus is 20 weeks size, irregular Neurological:     Mental Status: She is alert.     No results found for this or any previous visit (from  the past 24 hour(s)).  No results found.  Assessment/Plan: Symptomatic large myomatous uterus with anemia, menorrhagia and pain.  All medical and surgical options have been discussed, she desires definitive surgical treatment.  Surgical procedure, risks, chances of relieving her symptoms have all been discussed.  Will admit for TAH, bilateral salpingectomy, pre-op TAP block  Stefanie Moon 09/02/2020, 9:33 AM

## 2020-09-03 ENCOUNTER — Inpatient Hospital Stay (HOSPITAL_COMMUNITY): Payer: 59 | Admitting: Anesthesiology

## 2020-09-03 ENCOUNTER — Other Ambulatory Visit: Payer: Self-pay

## 2020-09-03 ENCOUNTER — Encounter (HOSPITAL_COMMUNITY): Payer: Self-pay | Admitting: Obstetrics and Gynecology

## 2020-09-03 ENCOUNTER — Inpatient Hospital Stay (HOSPITAL_COMMUNITY)
Admission: RE | Admit: 2020-09-03 | Discharge: 2020-09-06 | DRG: 742 | Disposition: A | Discharge status: Home / Self Care | Payer: 59 | Attending: Obstetrics and Gynecology | Admitting: Obstetrics and Gynecology

## 2020-09-03 ENCOUNTER — Encounter (HOSPITAL_COMMUNITY)
Admission: RE | Disposition: A | Discharge status: Home / Self Care | Payer: Self-pay | Source: Ambulatory Visit | Attending: Obstetrics and Gynecology

## 2020-09-03 DIAGNOSIS — Z20822 Contact with and (suspected) exposure to covid-19: Secondary | ICD-10-CM | POA: Diagnosis present

## 2020-09-03 DIAGNOSIS — Z888 Allergy status to other drugs, medicaments and biological substances status: Secondary | ICD-10-CM | POA: Diagnosis not present

## 2020-09-03 DIAGNOSIS — K567 Ileus, unspecified: Secondary | ICD-10-CM | POA: Diagnosis not present

## 2020-09-03 DIAGNOSIS — I10 Essential (primary) hypertension: Secondary | ICD-10-CM | POA: Diagnosis present

## 2020-09-03 DIAGNOSIS — Z90710 Acquired absence of both cervix and uterus: Secondary | ICD-10-CM | POA: Diagnosis present

## 2020-09-03 DIAGNOSIS — N946 Dysmenorrhea, unspecified: Secondary | ICD-10-CM | POA: Diagnosis present

## 2020-09-03 DIAGNOSIS — D259 Leiomyoma of uterus, unspecified: Secondary | ICD-10-CM | POA: Diagnosis present

## 2020-09-03 DIAGNOSIS — K219 Gastro-esophageal reflux disease without esophagitis: Secondary | ICD-10-CM | POA: Diagnosis present

## 2020-09-03 DIAGNOSIS — D649 Anemia, unspecified: Secondary | ICD-10-CM | POA: Diagnosis present

## 2020-09-03 DIAGNOSIS — N926 Irregular menstruation, unspecified: Secondary | ICD-10-CM | POA: Diagnosis present

## 2020-09-03 DIAGNOSIS — M1990 Unspecified osteoarthritis, unspecified site: Secondary | ICD-10-CM | POA: Diagnosis present

## 2020-09-03 HISTORY — PX: CYSTOSCOPY: SHX5120

## 2020-09-03 HISTORY — PX: HYSTERECTOMY ABDOMINAL WITH SALPINGECTOMY: SHX6725

## 2020-09-03 LAB — POCT PREGNANCY, URINE: Preg Test, Ur: NEGATIVE

## 2020-09-03 LAB — ABO/RH: ABO/RH(D): A POS

## 2020-09-03 SURGERY — HYSTERECTOMY, TOTAL, ABDOMINAL, WITH SALPINGECTOMY
Anesthesia: General | Site: Urethra

## 2020-09-03 SURGERY — HYSTERECTOMY, TOTAL, ABDOMINAL, WITH SALPINGECTOMY
Anesthesia: Choice

## 2020-09-03 MED ORDER — LACTATED RINGERS IV SOLN: INTRAVENOUS | Status: DC

## 2020-09-03 MED ORDER — PROMETHAZINE HCL 25 MG/ML IJ SOLN: 6.2500 mg | INTRAMUSCULAR | Status: DC | PRN

## 2020-09-03 MED ORDER — HYDRALAZINE HCL 20 MG/ML IJ SOLN
INTRAMUSCULAR | Status: AC
  Filled 2020-09-03: qty 1

## 2020-09-03 MED ORDER — PHENYLEPHRINE HCL-NACL 10-0.9 MG/250ML-% IV SOLN
INTRAVENOUS | Status: AC
  Filled 2020-09-03: qty 500

## 2020-09-03 MED ORDER — FENTANYL CITRATE (PF) 250 MCG/5ML IJ SOLN
INTRAMUSCULAR | Status: AC
  Filled 2020-09-03: qty 5

## 2020-09-03 MED ORDER — KETOROLAC TROMETHAMINE 30 MG/ML IJ SOLN
30.0000 mg | Freq: Four times a day (QID) | INTRAMUSCULAR | Status: AC
  Administered 2020-09-03 – 2020-09-04 (×4): 30 mg via INTRAVENOUS
  Filled 2020-09-03 (×4): qty 1

## 2020-09-03 MED ORDER — METHYLENE BLUE 0.5 % INJ SOLN
INTRAVENOUS | Status: AC
  Filled 2020-09-03: qty 10

## 2020-09-03 MED ORDER — IBUPROFEN 200 MG PO TABS
800.0000 mg | ORAL_TABLET | Freq: Four times a day (QID) | ORAL | Status: DC
  Administered 2020-09-04 – 2020-09-05 (×4): 800 mg via ORAL
  Filled 2020-09-03 (×4): qty 4

## 2020-09-03 MED ORDER — SUGAMMADEX SODIUM 200 MG/2ML IV SOLN
INTRAVENOUS | Status: DC | PRN
  Administered 2020-09-03: 200 mg via INTRAVENOUS

## 2020-09-03 MED ORDER — 0.9 % SODIUM CHLORIDE (POUR BTL) OPTIME
TOPICAL | Status: DC | PRN
  Administered 2020-09-03: 1000 mL

## 2020-09-03 MED ORDER — HYDROMORPHONE HCL 1 MG/ML IJ SOLN
1.0000 mg | INTRAMUSCULAR | Status: DC | PRN
  Administered 2020-09-03 – 2020-09-05 (×8): 1 mg via INTRAVENOUS
  Filled 2020-09-03 (×9): qty 1

## 2020-09-03 MED ORDER — KETOROLAC TROMETHAMINE 30 MG/ML IJ SOLN
30.0000 mg | Freq: Once | INTRAMUSCULAR | Status: AC
  Administered 2020-09-03: 30 mg via INTRAVENOUS

## 2020-09-03 MED ORDER — ONDANSETRON HCL 4 MG PO TABS
4.0000 mg | ORAL_TABLET | Freq: Four times a day (QID) | ORAL | Status: DC | PRN
  Administered 2020-09-05: 4 mg via ORAL
  Filled 2020-09-03: qty 1

## 2020-09-03 MED ORDER — ORAL CARE MOUTH RINSE: 15.0000 mL | Freq: Once | OROMUCOSAL | Status: AC

## 2020-09-03 MED ORDER — ACETAMINOPHEN 500 MG PO TABS
1000.0000 mg | ORAL_TABLET | Freq: Once | ORAL | Status: AC
  Administered 2020-09-03: 1000 mg via ORAL
  Filled 2020-09-03: qty 2

## 2020-09-03 MED ORDER — SIMETHICONE 80 MG PO CHEW
80.0000 mg | CHEWABLE_TABLET | Freq: Four times a day (QID) | ORAL | Status: DC | PRN
  Administered 2020-09-05 (×2): 80 mg via ORAL
  Filled 2020-09-03 (×3): qty 1

## 2020-09-03 MED ORDER — ROCURONIUM BROMIDE 10 MG/ML (PF) SYRINGE
PREFILLED_SYRINGE | INTRAVENOUS | Status: AC
  Filled 2020-09-03: qty 10

## 2020-09-03 MED ORDER — OXYCODONE HCL 5 MG PO TABS
5.0000 mg | ORAL_TABLET | ORAL | Status: DC | PRN
  Administered 2020-09-03: 10 mg via ORAL

## 2020-09-03 MED ORDER — MIDAZOLAM HCL 2 MG/2ML IJ SOLN
INTRAMUSCULAR | Status: DC | PRN
  Administered 2020-09-03 (×2): 1 mg via INTRAVENOUS

## 2020-09-03 MED ORDER — BUPIVACAINE LIPOSOME 1.3 % IJ SUSP
INTRAMUSCULAR | Status: DC | PRN
  Administered 2020-09-03 (×2): 10 mL

## 2020-09-03 MED ORDER — HYDROMORPHONE HCL 1 MG/ML IJ SOLN
INTRAMUSCULAR | Status: AC
  Filled 2020-09-03: qty 1

## 2020-09-03 MED ORDER — BISACODYL 10 MG RE SUPP: 10.0000 mg | Freq: Every day | RECTAL | Status: DC | PRN

## 2020-09-03 MED ORDER — ONDANSETRON HCL 4 MG/2ML IJ SOLN
INTRAMUSCULAR | Status: DC | PRN
  Administered 2020-09-03: 4 mg via INTRAVENOUS

## 2020-09-03 MED ORDER — CHLORHEXIDINE GLUCONATE 0.12 % MT SOLN
15.0000 mL | Freq: Once | OROMUCOSAL | Status: AC
  Administered 2020-09-03: 15 mL via OROMUCOSAL
  Filled 2020-09-03: qty 15

## 2020-09-03 MED ORDER — ALUM & MAG HYDROXIDE-SIMETH 200-200-20 MG/5ML PO SUSP: 30.0000 mL | ORAL | Status: DC | PRN

## 2020-09-03 MED ORDER — FENTANYL CITRATE (PF) 100 MCG/2ML IJ SOLN
INTRAMUSCULAR | Status: DC | PRN
Start: 2020-09-03 — End: 2020-09-03
  Administered 2020-09-03 (×2): 50 ug via INTRAVENOUS
  Administered 2020-09-03 (×2): 100 ug via INTRAVENOUS
  Administered 2020-09-03 (×2): 50 ug via INTRAVENOUS

## 2020-09-03 MED ORDER — BUPIVACAINE HCL (PF) 0.25 % IJ SOLN
INTRAMUSCULAR | Status: DC | PRN
  Administered 2020-09-03 (×2): 15 mL via EPIDURAL

## 2020-09-03 MED ORDER — ROCURONIUM BROMIDE 10 MG/ML (PF) SYRINGE
PREFILLED_SYRINGE | INTRAVENOUS | Status: DC | PRN
  Administered 2020-09-03: 100 mg via INTRAVENOUS
  Administered 2020-09-03: 20 mg via INTRAVENOUS

## 2020-09-03 MED ORDER — POVIDONE-IODINE 10 % EX SWAB: 2.0000 "application " | Freq: Once | CUTANEOUS | Status: DC

## 2020-09-03 MED ORDER — ALBUMIN HUMAN 5 % IV SOLN: INTRAVENOUS | Status: DC | PRN

## 2020-09-03 MED ORDER — STERILE WATER FOR IRRIGATION IR SOLN
Status: DC | PRN
  Administered 2020-09-03: 1000 mL

## 2020-09-03 MED ORDER — ONDANSETRON HCL 4 MG/2ML IJ SOLN
INTRAMUSCULAR | Status: AC
  Filled 2020-09-03: qty 2

## 2020-09-03 MED ORDER — ACETAMINOPHEN 500 MG PO TABS
1000.0000 mg | ORAL_TABLET | Freq: Four times a day (QID) | ORAL | Status: DC
  Administered 2020-09-03 – 2020-09-05 (×8): 1000 mg via ORAL
  Filled 2020-09-03 (×8): qty 2

## 2020-09-03 MED ORDER — ONDANSETRON HCL 4 MG/2ML IJ SOLN
4.0000 mg | Freq: Four times a day (QID) | INTRAMUSCULAR | Status: DC | PRN
  Administered 2020-09-05 – 2020-09-06 (×2): 4 mg via INTRAVENOUS
  Filled 2020-09-03 (×3): qty 2

## 2020-09-03 MED ORDER — SUCCINYLCHOLINE CHLORIDE 200 MG/10ML IV SOSY
PREFILLED_SYRINGE | INTRAVENOUS | Status: AC
  Filled 2020-09-03: qty 10

## 2020-09-03 MED ORDER — OXYCODONE HCL 5 MG PO TABS
ORAL_TABLET | ORAL | Status: AC
  Filled 2020-09-03: qty 2

## 2020-09-03 MED ORDER — PHENYLEPHRINE 40 MCG/ML (10ML) SYRINGE FOR IV PUSH (FOR BLOOD PRESSURE SUPPORT)
PREFILLED_SYRINGE | INTRAVENOUS | Status: DC | PRN
  Administered 2020-09-03: 80 ug via INTRAVENOUS
  Administered 2020-09-03 (×2): 40 ug via INTRAVENOUS
  Administered 2020-09-03: 80 ug via INTRAVENOUS

## 2020-09-03 MED ORDER — GABAPENTIN 300 MG PO CAPS
300.0000 mg | ORAL_CAPSULE | ORAL | Status: AC
  Administered 2020-09-03: 300 mg via ORAL
  Filled 2020-09-03: qty 1

## 2020-09-03 MED ORDER — FENTANYL CITRATE (PF) 100 MCG/2ML IJ SOLN
25.0000 ug | INTRAMUSCULAR | Status: DC | PRN
  Administered 2020-09-03 (×3): 50 ug via INTRAVENOUS

## 2020-09-03 MED ORDER — CELECOXIB 200 MG PO CAPS
200.0000 mg | ORAL_CAPSULE | Freq: Once | ORAL | Status: AC
  Administered 2020-09-03: 200 mg via ORAL
  Filled 2020-09-03: qty 1

## 2020-09-03 MED ORDER — CHLORHEXIDINE GLUCONATE CLOTH 2 % EX PADS
6.0000 | MEDICATED_PAD | Freq: Every day | CUTANEOUS | Status: DC
  Administered 2020-09-03 – 2020-09-06 (×4): 6 via TOPICAL

## 2020-09-03 MED ORDER — MENTHOL 3 MG MT LOZG: 1.0000 | LOZENGE | OROMUCOSAL | Status: DC | PRN

## 2020-09-03 MED ORDER — PROPOFOL 10 MG/ML IV BOLUS
INTRAVENOUS | Status: AC
  Filled 2020-09-03: qty 20

## 2020-09-03 MED ORDER — FENTANYL CITRATE (PF) 100 MCG/2ML IJ SOLN
INTRAMUSCULAR | Status: AC
  Filled 2020-09-03: qty 2

## 2020-09-03 MED ORDER — PROPOFOL 10 MG/ML IV BOLUS
INTRAVENOUS | Status: DC | PRN
  Administered 2020-09-03: 50 mg via INTRAVENOUS
  Administered 2020-09-03: 150 mg via INTRAVENOUS

## 2020-09-03 MED ORDER — DEXAMETHASONE SODIUM PHOSPHATE 10 MG/ML IJ SOLN
INTRAMUSCULAR | Status: DC | PRN
  Administered 2020-09-03: 4 mg via INTRAVENOUS

## 2020-09-03 MED ORDER — LABETALOL HCL 5 MG/ML IV SOLN: 10.0000 mg | INTRAVENOUS | Status: DC | PRN

## 2020-09-03 MED ORDER — HYDROMORPHONE HCL 1 MG/ML IJ SOLN
0.5000 mg | INTRAMUSCULAR | Status: AC | PRN
  Administered 2020-09-03 (×4): 0.5 mg via INTRAVENOUS

## 2020-09-03 MED ORDER — HYDRALAZINE HCL 20 MG/ML IJ SOLN
5.0000 mg | INTRAMUSCULAR | Status: AC | PRN
  Administered 2020-09-03 (×2): 5 mg via INTRAVENOUS

## 2020-09-03 MED ORDER — LABETALOL HCL 5 MG/ML IV SOLN
INTRAVENOUS | Status: AC
  Filled 2020-09-03: qty 4

## 2020-09-03 MED ORDER — PHENYLEPHRINE 40 MCG/ML (10ML) SYRINGE FOR IV PUSH (FOR BLOOD PRESSURE SUPPORT)
PREFILLED_SYRINGE | INTRAVENOUS | Status: AC
  Filled 2020-09-03: qty 10

## 2020-09-03 MED ORDER — LISINOPRIL 20 MG PO TABS
20.0000 mg | ORAL_TABLET | Freq: Every day | ORAL | Status: DC
  Administered 2020-09-03 – 2020-09-06 (×4): 20 mg via ORAL
  Filled 2020-09-03 (×4): qty 1

## 2020-09-03 MED ORDER — KETOROLAC TROMETHAMINE 30 MG/ML IJ SOLN
INTRAMUSCULAR | Status: AC
  Filled 2020-09-03: qty 1

## 2020-09-03 MED ORDER — GABAPENTIN 300 MG PO CAPS
300.0000 mg | ORAL_CAPSULE | Freq: Three times a day (TID) | ORAL | Status: DC
  Administered 2020-09-03 – 2020-09-06 (×9): 300 mg via ORAL
  Filled 2020-09-03 (×9): qty 1

## 2020-09-03 MED ORDER — CEFAZOLIN SODIUM-DEXTROSE 2-4 GM/100ML-% IV SOLN
2.0000 g | INTRAVENOUS | Status: AC
  Administered 2020-09-03: 2 g via INTRAVENOUS
  Filled 2020-09-03: qty 100

## 2020-09-03 MED ORDER — DEXTROSE-NACL 5-0.45 % IV SOLN: INTRAVENOUS | Status: DC

## 2020-09-03 MED ORDER — MIDAZOLAM HCL 2 MG/2ML IJ SOLN
INTRAMUSCULAR | Status: AC
  Filled 2020-09-03: qty 2

## 2020-09-03 MED ORDER — LABETALOL HCL 5 MG/ML IV SOLN
5.0000 mg | INTRAVENOUS | Status: AC | PRN
  Administered 2020-09-03 (×3): 5 mg via INTRAVENOUS

## 2020-09-03 MED ORDER — LABETALOL HCL 5 MG/ML IV SOLN
5.0000 mg | Freq: Once | INTRAVENOUS | Status: AC
  Administered 2020-09-03: 5 mg via INTRAVENOUS

## 2020-09-03 MED ORDER — SCOPOLAMINE 1 MG/3DAYS TD PT72
1.0000 | MEDICATED_PATCH | TRANSDERMAL | Status: DC
  Administered 2020-09-03: 1.5 mg via TRANSDERMAL
  Filled 2020-09-03: qty 1

## 2020-09-03 MED ORDER — LIDOCAINE 2% (20 MG/ML) 5 ML SYRINGE
INTRAMUSCULAR | Status: AC
  Filled 2020-09-03: qty 5

## 2020-09-03 SURGICAL SUPPLY — 41 items
BENZOIN TINCTURE PRP APPL 2/3 (GAUZE/BANDAGES/DRESSINGS) IMPLANT
CANISTER SUCT 3000ML PPV (MISCELLANEOUS) ×3 IMPLANT
CATH ROBINSON RED A/P 16FR (CATHETERS) ×3 IMPLANT
COVER WAND RF STERILE (DRAPES) ×3 IMPLANT
DRAPE HYSTEROSCOPY (DRAPE) ×3 IMPLANT
DRAPE WARM FLUID 44X44 (DRAPES) ×3 IMPLANT
DRSG OPSITE POSTOP 4X10 (GAUZE/BANDAGES/DRESSINGS) ×3 IMPLANT
DURAPREP 26ML APPLICATOR (WOUND CARE) ×3 IMPLANT
GAUZE 4X4 16PLY RFD (DISPOSABLE) ×3 IMPLANT
GLOVE BIOGEL PI IND STRL 7.0 (GLOVE) ×4 IMPLANT
GLOVE BIOGEL PI INDICATOR 7.0 (GLOVE) ×2
GLOVE ORTHO TXT STRL SZ7.5 (GLOVE) ×3 IMPLANT
GOWN STRL REUS W/ TWL LRG LVL3 (GOWN DISPOSABLE) ×6 IMPLANT
GOWN STRL REUS W/TWL LRG LVL3 (GOWN DISPOSABLE) ×3
HIBICLENS CHG 4% 4OZ BTL (MISCELLANEOUS) ×3 IMPLANT
KIT TURNOVER KIT B (KITS) ×3 IMPLANT
NS IRRIG 1000ML POUR BTL (IV SOLUTION) ×6 IMPLANT
PACK ABDOMINAL GYN (CUSTOM PROCEDURE TRAY) ×3 IMPLANT
PAD ARMBOARD 7.5X6 YLW CONV (MISCELLANEOUS) ×3 IMPLANT
PAD OB MATERNITY 4.3X12.25 (PERSONAL CARE ITEMS) ×3 IMPLANT
RTRCTR C-SECT PINK 25CM LRG (MISCELLANEOUS) ×3 IMPLANT
SET CYSTO W/LG BORE CLAMP LF (SET/KITS/TRAYS/PACK) ×3 IMPLANT
SHEARS FOC LG CVD HARMONIC 17C (MISCELLANEOUS) ×3 IMPLANT
SHEET LAVH (DRAPES) ×3 IMPLANT
SPONGE LAP 18X18 RF (DISPOSABLE) ×3 IMPLANT
STAPLER VISISTAT 35W (STAPLE) ×3 IMPLANT
STRIP CLOSURE SKIN 1/2X4 (GAUZE/BANDAGES/DRESSINGS) IMPLANT
SUT CHROMIC 1MO 4 18 CR8 (SUTURE) ×6 IMPLANT
SUT PDS AB 0 CTX 60 (SUTURE) IMPLANT
SUT PLAIN 2 0 XLH (SUTURE) ×3 IMPLANT
SUT SILK 2 0 SH (SUTURE) IMPLANT
SUT VIC AB 0 CT1 27 (SUTURE)
SUT VIC AB 0 CT1 27XBRD ANBCTR (SUTURE) IMPLANT
SUT VIC AB 2-0 CT1 27 (SUTURE) ×1
SUT VIC AB 2-0 CT1 TAPERPNT 27 (SUTURE) ×2 IMPLANT
SUT VIC AB 3-0 SH 27 (SUTURE)
SUT VIC AB 3-0 SH 27X BRD (SUTURE) IMPLANT
SUT VIC AB 4-0 KS 27 (SUTURE) ×3 IMPLANT
SUT VICRYL 0 TIES 12 18 (SUTURE) ×3 IMPLANT
TOWEL GREEN STERILE FF (TOWEL DISPOSABLE) ×6 IMPLANT
TRAY FOLEY W/BAG SLVR 14FR (SET/KITS/TRAYS/PACK) ×3 IMPLANT

## 2020-09-03 NOTE — Anesthesia Procedure Notes (Signed)
Procedure Name: Intubation Date/Time: 09/03/2020 7:34 AM Performed by: Barrington Ellison, CRNA Pre-anesthesia Checklist: Patient identified, Emergency Drugs available, Suction available and Patient being monitored Patient Re-evaluated:Patient Re-evaluated prior to induction Oxygen Delivery Method: Circle system utilized Preoxygenation: Pre-oxygenation with 100% oxygen Induction Type: IV induction Ventilation: Mask ventilation without difficulty Laryngoscope Size: Mac and 4 Grade View: Grade I Tube type: Oral Tube size: 7.0 mm Number of attempts: 1 Airway Equipment and Method: Stylet and Oral airway Placement Confirmation: ETT inserted through vocal cords under direct vision,  positive ETCO2 and breath sounds checked- equal and bilateral Secured at: 22 cm Tube secured with: Tape Dental Injury: Teeth and Oropharynx as per pre-operative assessment  Comments: Performed by Jacqualine Code SRNA under supervision.

## 2020-09-03 NOTE — Anesthesia Procedure Notes (Addendum)
Anesthesia Regional Block: TAP block   Pre-Anesthetic Checklist: ,, timeout performed, Correct Patient, Correct Site, Correct Laterality, Correct Procedure, Correct Position, site marked, Risks and benefits discussed,  Surgical consent,  Pre-op evaluation,  At surgeon's request and post-op pain management  Laterality: Left  Prep: chloraprep       Needles:  Injection technique: Single-shot  Needle Type: Echogenic Stimulator Needle     Needle Length: 10cm  Needle Gauge: 21     Additional Needles:   Narrative:  Start time: 09/03/2020 7:09 AM End time: 09/03/2020 7:14 AM Injection made incrementally with aspirations every 5 mL.  Performed by: Personally

## 2020-09-03 NOTE — Progress Notes (Signed)
Post-op check, TAH Doing ok, still some pain low midline, no n/v, has ambulated Afeb, VSS, BP labile Abd- soft, dressing C/D/I  Will continue routine care, discussed surgery

## 2020-09-03 NOTE — Anesthesia Postprocedure Evaluation (Signed)
Anesthesia Post Note  Patient: Candence Sease  Procedure(s) Performed: HYSTERECTOMY ABDOMINAL WITH SALPINGECTOMY (Bilateral Abdomen) CYSTOSCOPY (N/A Urethra)     Patient location during evaluation: PACU Anesthesia Type: General Level of consciousness: sedated Pain management: pain level controlled Vital Signs Assessment: post-procedure vital signs reviewed and stable Respiratory status: spontaneous breathing and respiratory function stable Cardiovascular status: stable Postop Assessment: no apparent nausea or vomiting Anesthetic complications: no   No complications documented.  Last Vitals:  Vitals:   09/03/20 1345 09/03/20 1404  BP: 136/82 (!) 144/84  Pulse: 89 84  Resp: 19 18  Temp:  36.7 C  SpO2: 93% 97%    Last Pain:  Vitals:   09/03/20 1404  TempSrc: Oral  PainSc:                  Wyndi Northrup DANIEL

## 2020-09-03 NOTE — Transfer of Care (Signed)
Immediate Anesthesia Transfer of Care Note  Patient: Stefanie Moon  Procedure(s) Performed: HYSTERECTOMY ABDOMINAL WITH SALPINGECTOMY (Bilateral Abdomen) CYSTOSCOPY (N/A Urethra)  Patient Location: PACU  Anesthesia Type:General  Level of Consciousness: awake and patient cooperative  Airway & Oxygen Therapy: Patient Spontanous Breathing and Patient connected to face mask oxygen  Post-op Assessment: Report given to RN and Post -op Vital signs reviewed and stable  Post vital signs: Reviewed  Last Vitals:  Vitals Value Taken Time  BP 180/98 09/03/20 1019  Temp    Pulse 97 09/03/20 1021  Resp 19 09/03/20 1021  SpO2 99 % 09/03/20 1021  Vitals shown include unvalidated device data.  Last Pain:  Vitals:   09/03/20 0615  TempSrc: Temporal  PainSc: 7       Patients Stated Pain Goal: 3 (83/29/19 1660)  Complications: No complications documented.

## 2020-09-03 NOTE — Progress Notes (Signed)
Patient is transferred from room 3C02 to unit 3W36 at this time. Alert and in stable condition. Report given to receiving nurse Barnett Applebaum, RN with all questions answered. Left unit via wheelchair with all belongings at side.

## 2020-09-03 NOTE — Op Note (Signed)
Preoperative diagnosis: Symptomatic myomatous uterus Postoperative diagnosis: Same Procedure: Total abdominal hysterectomy, bilateral salpingectomy, cystoscopy Surgeon: Cheri Fowler M.D. Assistant: Eula Flax, MD Anesthesia:  GETA, bilateral TAP block pre-op Findings: She had a significantly enlarged uterus with omentum adherent anteriorly and to left adnexa, normal tubes and ovaries. Estimated blood CVEL:381OF Complications: None  Procedure in detail:  The patient was taken to the operating room and placed in the dorsal supine position.  General anesthesia was induced and she was placed in mobile stirrups.  Abdomen perineum and vagina were prepped and draped in the usual sterile fashion and a Foley catheter was placed. Her abdomen was then entered via a standard Pfannenstiel incision.  Omental adhesions were taken down from the anterior uterus to make room for the retractor.  An Alexis self-retaining retractor was placed and bowels were packed out of the pelvis. With some difficulty the uterus was delivered through the incision  Uterine cornu were grasped with large Kelly clamps. Further omental adhesions on the left were taken down with the harmonic scalpel.  Both fallopian tubes were identified and traced to their fimbriated ends.  They were elevated and harmonic scalpel was used to free the tubes from the ovaries and take down the mesosalpinx to the uterus.  The tubes were then removed and sent with the final specimen.  Utero-ovarian ligaments were then taken down with harmonic scalpel to free the ovaries on each side.  Harmonic scalpel then was then used to take down the broad ligaments, cardinal ligaments, and incise across the anterior portion of the peritoneum to free the bladder.  Zeppelin clamps were then used to clamp, cut then suture with #1 chromic the uterine arteries on each side.  A further bite with a Zeppelin I was then taken on each side to help free the cervix.  Harmonic scalpel  was then used to free the cervix from the vagina. Vaginal angles were then sutured with #1 chromic and 2 figure-of-eight sutures of #1 chromic were then used to close the rest of the vaginal cuff. An active bleeder at the left angle was then controlled with another figure-of-eight suture of #1 chromic.  Pedicles were inspected and found to be hemostatic. The pelvis was irrigated and found to be hemostatic. The lap sponges were removed from the abdomen and the Alexis retractor was removed. Peritoneum was identified and closed with running 2-0 Vicryl. Fascia was closed in a running fashion starting at both ends and meeting in the middle with 0 Vicryl. Subcutaneous tissue was irrigated and made hemostatic with Bovie. Subcutaneous tissue was then closed with running 2-0 plain gut suture. Skin incision was then closed with staples followed by sterile dressing.   Attention was turned to cystoscopy.  Her legs were elevated in stirrups.  Her Foley catheter was removed.  A 70 degree cystoscope was inserted gotten good visualization of the bladder was achieved after about 200 cc of fluid was instilled.  The bladder appeared intact.  Both ureteral orifice ease were easily identified and clear urine was seen to flow freely from each orifice.  The cystoscope was removed and the Foley catheter was replaced.  Patient tolerated procedure well and was taken to the PACU in stable condition. Counts were correct, she had PAS hose on throughout the procedure, she received Ancef 2 gms at the beginning of the procedure.

## 2020-09-03 NOTE — Anesthesia Procedure Notes (Addendum)
Anesthesia Regional Block: TAP block   Pre-Anesthetic Checklist: ,, timeout performed, Correct Patient, Correct Site, Correct Laterality, Correct Procedure, Correct Position, site marked, Risks and benefits discussed,  Surgical consent,  Pre-op evaluation,  At surgeon's request and post-op pain management  Laterality: Right  Prep: chloraprep       Needles:  Injection technique: Single-shot  Needle Type: Echogenic Stimulator Needle     Needle Length: 10cm  Needle Gauge: 21     Additional Needles:   Narrative:  Start time: 09/03/2020 7:01 AM End time: 09/03/2020 7:09 AM Injection made incrementally with aspirations every 5 mL.  Performed by: Personally

## 2020-09-03 NOTE — Interval H&P Note (Signed)
History and Physical Interval Note:  09/03/2020 7:19 AM  Stefanie Moon  has presented today for surgery, with the diagnosis of stmptomatic myomas.  The various methods of treatment have been discussed with the patient and family. After consideration of risks, benefits and other options for treatment, the patient has consented to  Procedure(s) with comments: HYSTERECTOMY ABDOMINAL WITH SALPINGECTOMY (Bilateral) - TAP Block CYSTOSCOPY (N/A) as a surgical intervention.  The patient's history has been reviewed, patient examined, no change in status, stable for surgery.  I have reviewed the patient's chart and labs.  Questions were answered to the patient's satisfaction.     Blane Ohara Berthe Oley

## 2020-09-04 ENCOUNTER — Encounter (HOSPITAL_COMMUNITY): Payer: Self-pay | Admitting: Obstetrics and Gynecology

## 2020-09-04 LAB — CBC
HCT: 24.3 % — ABNORMAL LOW (ref 36.0–46.0)
Hemoglobin: 6.9 g/dL — CL (ref 12.0–15.0)
MCH: 21.7 pg — ABNORMAL LOW (ref 26.0–34.0)
MCHC: 28.4 g/dL — ABNORMAL LOW (ref 30.0–36.0)
MCV: 76.4 fL — ABNORMAL LOW (ref 80.0–100.0)
Platelets: 288 10*3/uL (ref 150–400)
RBC: 3.18 MIL/uL — ABNORMAL LOW (ref 3.87–5.11)
RDW: 18.3 % — ABNORMAL HIGH (ref 11.5–15.5)
WBC: 12.9 10*3/uL — ABNORMAL HIGH (ref 4.0–10.5)
nRBC: 0 % (ref 0.0–0.2)

## 2020-09-04 LAB — SURGICAL PATHOLOGY

## 2020-09-04 NOTE — Progress Notes (Signed)
1 Day Post-Op Procedure(s) (LRB): HYSTERECTOMY ABDOMINAL WITH SALPINGECTOMY (Bilateral) CYSTOSCOPY (N/A)  Subjective: Patient reports incisional pain, tolerating PO and no problems voiding.    Objective: I have reviewed patient's vital signs, intake and output and labs.  General: alert GI: soft, appropriately tender, dressing C/D/I   Hgb 6.9   Assessment: s/p Procedure(s) with comments: HYSTERECTOMY ABDOMINAL WITH SALPINGECTOMY (Bilateral) - TAP Block CYSTOSCOPY (N/A): stable, progressing well and anemia  Plan: Advance diet Encourage ambulation Discontinue IV fluids  Discussed anemia and possible transfusion.  She is used to anemia, would like to see if she can do woithout transfusion, so will monitor for symptoms today   LOS: 1 day    Stefanie Moon 09/04/2020, 8:29 AM

## 2020-09-04 NOTE — Progress Notes (Signed)
CRITICAL VALUE ALERT  Critical Value:  hgb 6.9   Date & Time Notied:  09/04/2020 0435   Provider Notified: Dr Terri Piedra   Orders Received/Actions taken:  No new order received.. Attending MD to address  This am.  Will cont to monitor patient.

## 2020-09-04 NOTE — Progress Notes (Signed)
OOB IN HALLWAY TO AMBULATE, APPROX 100 FT, NO ASSISTANCE. SOME FATIGUE OBVIOUS, NO GROSS IMBALANCE NOTED, O2 SAT 99% ON RA. BP= 110/60 AFTER RETURNING TO ROOM.

## 2020-09-05 LAB — PREPARE RBC (CROSSMATCH)

## 2020-09-05 MED ORDER — SODIUM CHLORIDE 0.9% IV SOLUTION: Freq: Once | INTRAVENOUS | Status: AC

## 2020-09-05 MED ORDER — SENNOSIDES-DOCUSATE SODIUM 8.6-50 MG PO TABS
2.0000 | ORAL_TABLET | Freq: Every day | ORAL | Status: DC
  Administered 2020-09-05: 2 via ORAL
  Filled 2020-09-05: qty 2

## 2020-09-05 MED ORDER — BISACODYL 10 MG RE SUPP
10.0000 mg | Freq: Once | RECTAL | Status: AC
  Administered 2020-09-05: 10 mg via RECTAL
  Filled 2020-09-05: qty 1

## 2020-09-05 MED ORDER — MAGNESIUM HYDROXIDE 400 MG/5ML PO SUSP
30.0000 mL | Freq: Every day | ORAL | Status: DC
  Filled 2020-09-05 (×2): qty 30

## 2020-09-05 MED ORDER — OXYCODONE HCL 5 MG PO TABS
5.0000 mg | ORAL_TABLET | ORAL | Status: DC | PRN
  Administered 2020-09-05 (×2): 5 mg via ORAL
  Filled 2020-09-05 (×2): qty 1

## 2020-09-05 NOTE — Progress Notes (Signed)
POD #2 TAH  Having upper abdominal discomfort.  Has passed some gas, no BM.  Some nausea, no emesis.  Able to ambulate without issues Afeb, VSS Abd- soft, slightly distended, incision intact  Overall doing well, but having increased abd pain today.  I think her pain is more bowel related, will try MOM and Dulcolax supp.  Also, no oxycodone ordered, so will order that prn instead of dilaudid

## 2020-09-05 NOTE — Progress Notes (Signed)
Still some cramping but a bit better.  Ambulated but was lightheaded.  Received dulcolax suppository, declined MOM  Abd- soft, still slightly distended, incision intact  Discussed that she is most likely lightheaded due to her anemia, recommended transfusion of one unit PRBC, discussed risks, she agrees  Will check on her tonight and see if able to d/c home

## 2020-09-06 LAB — CBC WITH DIFFERENTIAL/PLATELET
Abs Immature Granulocytes: 0.08 10*3/uL — ABNORMAL HIGH (ref 0.00–0.07)
Basophils Absolute: 0.1 10*3/uL (ref 0.0–0.1)
Basophils Relative: 0 %
Eosinophils Absolute: 0.4 10*3/uL (ref 0.0–0.5)
Eosinophils Relative: 3 %
HCT: 27.3 % — ABNORMAL LOW (ref 36.0–46.0)
Hemoglobin: 8.2 g/dL — ABNORMAL LOW (ref 12.0–15.0)
Immature Granulocytes: 1 %
Lymphocytes Relative: 12 %
Lymphs Abs: 1.6 10*3/uL (ref 0.7–4.0)
MCH: 23.4 pg — ABNORMAL LOW (ref 26.0–34.0)
MCHC: 30 g/dL (ref 30.0–36.0)
MCV: 78 fL — ABNORMAL LOW (ref 80.0–100.0)
Monocytes Absolute: 0.9 10*3/uL (ref 0.1–1.0)
Monocytes Relative: 7 %
Neutro Abs: 10.5 10*3/uL — ABNORMAL HIGH (ref 1.7–7.7)
Neutrophils Relative %: 77 %
Platelets: 282 10*3/uL (ref 150–400)
RBC: 3.5 MIL/uL — ABNORMAL LOW (ref 3.87–5.11)
RDW: 17.7 % — ABNORMAL HIGH (ref 11.5–15.5)
WBC: 13.6 10*3/uL — ABNORMAL HIGH (ref 4.0–10.5)
nRBC: 0.1 % (ref 0.0–0.2)

## 2020-09-06 LAB — TYPE AND SCREEN
ABO/RH(D): A POS
Antibody Screen: NEGATIVE
Unit division: 0

## 2020-09-06 LAB — BPAM RBC
Blood Product Expiration Date: 202110022359
ISSUE DATE / TIME: 202109091408
Unit Type and Rh: 6200

## 2020-09-06 MED ORDER — IBUPROFEN 800 MG PO TABS
800.0000 mg | ORAL_TABLET | Freq: Three times a day (TID) | ORAL | 0 refills | Status: DC | PRN
Start: 2020-09-06 — End: 2021-05-21

## 2020-09-06 MED ORDER — OXYCODONE HCL 5 MG PO TABS
5.0000 mg | ORAL_TABLET | ORAL | 0 refills | Status: DC | PRN
Start: 2020-09-06 — End: 2021-02-01

## 2020-09-06 NOTE — Progress Notes (Signed)
POD #3 TAH  Feeling better today, +BM, + flatus Afeb, VSS Abd- soft, less distended, incision intact  Hgb 8.2  Will d/c home

## 2020-09-06 NOTE — Discharge Instructions (Signed)
Routine instructions for abdominal hysterectomy

## 2020-09-06 NOTE — TOC Transition Note (Signed)
Transition of Care [facility]) - CM/SW Discharge Note   Patient Details  Name: Stefanie Moon MRN: [id number] Date of Birth: 1971/02/01  Transition of [facility]) CM/SW Contact:  Pollie Friar, RN Phone Number: 09/06/2020, 11:22 AM   Clinical Narrative:    Pt discharged home with self care. Pt has transportation home.   Final next level of care: Home/Self Care Barriers to Discharge: No Barriers Identified   Patient Goals and CMS Choice        Discharge Placement                       Discharge Plan and Services                                     Social Determinants of Health (SDOH) Interventions     Readmission Risk Interventions No flowsheet data found.

## 2020-09-06 NOTE — Discharge Summary (Signed)
Physician Discharge Summary  Patient ID: Stefanie Moon MRN: [id number] DOB/AGE: August 14, 1971 49 y.o.  Admit date: 09/03/2020 Discharge date: 09/06/2020  Admission Diagnoses:  Symptomatic myomatous uterus  Discharge Diagnoses: Same, anemia Active Problems:   Uterine leiomyoma   S/P abdominal hysterectomy   Discharged Condition: good  Hospital Course: Pt admitted for TAH performed without complications, EBL 622 cc.  Post-op recovery was a bit sluggish, post-op Hgb 6.9, required transfusion of 1 unit PRBC and Hgb 8.2 after that.  Had a mild ileus that resolved overnight of POD #2.  Stable for discharge morning of POD #3   Discharge Exam: Blood pressure 130/82, pulse 92, temperature 98.7 F (37.1 C), temperature source Oral, resp. rate 18, height 5\' 4"  (1.626 m), weight 103 kg, SpO2 98 %. General appearance: alert  Disposition: Discharge disposition: 01-Home or Self Care       Discharge Instructions    Call MD for:  difficulty breathing, headache or visual disturbances   Complete by: As directed    Call MD for:  extreme fatigue   Complete by: As directed    Call MD for:  persistant dizziness or light-headedness   Complete by: As directed    Call MD for:  persistant nausea and vomiting   Complete by: As directed    Call MD for:  redness, tenderness, or signs of infection (pain, swelling, redness, odor or green/yellow discharge around incision site)   Complete by: As directed    Call MD for:  severe uncontrolled pain   Complete by: As directed    Call MD for:  temperature >100.4   Complete by: As directed    Diet - low sodium heart healthy   Complete by: As directed    Increase activity slowly   Complete by: As directed    Lifting restrictions   Complete by: As directed    10 lbs   Sexual Activity Restrictions   Complete by: As directed    Pelvic rest     Allergies as of 09/06/2020      Reactions   Tramadol Hives, Rash      Medication List    STOP taking these  medications   Stefanie Moon 0.35 MG tablet Generic drug: norethindrone   traMADol 50 MG tablet Commonly known as: ULTRAM     TAKE these medications   acetaminophen 500 MG tablet Commonly known as: TYLENOL Take 500 mg by mouth every 6 (six) hours as needed for mild pain or moderate pain.   amLODipine 5 MG tablet Commonly known as: NORVASC Take 1 tablet (5 mg total) by mouth daily.   Fusion Plus Caps Take 1 capsule by mouth daily.   ibuprofen 800 MG tablet Commonly known as: ADVIL Take 1 tablet (800 mg total) by mouth 3 (three) times daily. What changed: Another medication with the same name was added. Make sure you understand how and when to take each.   ibuprofen 800 MG tablet Commonly known as: ADVIL Take 1 tablet (800 mg total) by mouth every 8 (eight) hours as needed. What changed: You were already taking a medication with the same name, and this prescription was added. Make sure you understand how and when to take each.   lisinopril 20 MG tablet Commonly known as: ZESTRIL Take 20 mg by mouth daily.   NEXIUM PO Take by mouth daily.   oxyCODONE 5 MG immediate release tablet Commonly known as: Oxy IR/ROXICODONE Take 1 tablet (5 mg total) by mouth every 4 (four) hours as needed  for severe pain.       Follow-up Information    Almeda Ezra, MD. Schedule an appointment as soon as possible for a visit in 4 day(s).   Specialty: Obstetrics and Gynecology Why: for staple removal Contact information: [address] [place] [address] [place] [address] [phone]               Signed: Blane Ohara Denis Koppel 09/06/2020, 7:52 AM

## 2020-09-25 ENCOUNTER — Inpatient Hospital Stay: Admit: 2020-09-25 | Payer: 59 | Admitting: Obstetrics and Gynecology

## 2021-02-01 ENCOUNTER — Ambulatory Visit
Admission: EM | Admit: 2021-02-01 | Discharge: 2021-02-01 | Disposition: A | Discharge status: Home / Self Care | Payer: 59 | Attending: Emergency Medicine | Admitting: Emergency Medicine

## 2021-02-01 ENCOUNTER — Other Ambulatory Visit: Payer: Self-pay

## 2021-02-01 DIAGNOSIS — I10 Essential (primary) hypertension: Secondary | ICD-10-CM

## 2021-02-01 DIAGNOSIS — J019 Acute sinusitis, unspecified: Secondary | ICD-10-CM | POA: Diagnosis not present

## 2021-02-01 MED ORDER — AMLODIPINE BESYLATE 5 MG PO TABS: 5.0000 mg | ORAL_TABLET | Freq: Every day | ORAL | 0 refills | Status: DC

## 2021-02-01 MED ORDER — AMOXICILLIN-POT CLAVULANATE 875-125 MG PO TABS: 1.0000 | ORAL_TABLET | Freq: Two times a day (BID) | ORAL | 0 refills | Status: AC

## 2021-02-01 MED ORDER — FLUTICASONE PROPIONATE 50 MCG/ACT NA SUSP: 1.0000 | Freq: Every day | NASAL | 0 refills | Status: DC

## 2021-02-01 NOTE — ED Provider Notes (Signed)
[facility]    CSN: [id number] Arrival date & time: 02/01/21  4580      History   Chief Complaint Chief Complaint  Patient presents with  . Nasal Congestion    X 2 months  . Hypertension    X 2 week    HPI Stefanie Moon is a 50 y.o. female history of hypertension presenting today for evaluation of nasal congestion.  Reports nasal congestion for approximately 1.5 months.  Reports using over-the-counter allergy medicine without relief, congestion has been worsening over the past week.  She denies any significant cough, but does note mucus draining into throat.  Denies fevers.  Reports history of hypertension, currently out of blood pressure medicine.  Previously on lisinopril 20 mg in the past, was seen here over the summer 6 months ago and was given amlodipine as alternative and had plans to follow-up with primary care.  She reports that she is trying to get in with primary care.  Denies any problems with amlodipine, unsure of help pressure was controlled while on this medicine.  HPI  Past Medical History:  Diagnosis Date  . Anemia   . Arthritis    knee  . Blood transfusion without reported diagnosis   . Fibroids   . GERD (gastroesophageal reflux disease)   . Hypertension     Patient Active Problem List   Diagnosis Date Noted  . Uterine leiomyoma 09/03/2020  . S/P abdominal hysterectomy 09/03/2020    Past Surgical History:  Procedure Laterality Date  . APPENDECTOMY    . CYSTOSCOPY N/A 09/03/2020   Procedure: CYSTOSCOPY;  Surgeon: Cheri Fowler, MD;  Location: [place];  Service: Gynecology;  Laterality: N/A;  . HYSTERECTOMY ABDOMINAL WITH SALPINGECTOMY Bilateral 09/03/2020   Procedure: HYSTERECTOMY ABDOMINAL WITH SALPINGECTOMY;  Surgeon: Cheri Fowler, MD;  Location: [place];  Service: Gynecology;  Laterality: Bilateral;  TAP Block    OB History   No obstetric history on file.      Home Medications    Prior to Admission medications    Medication Sig Start Date End Date Taking? Authorizing Provider  acetaminophen (TYLENOL) 500 MG tablet Take 500 mg by mouth every 6 (six) hours as needed for mild pain or moderate pain.   Yes [provider]  amLODipine (NORVASC) 5 MG tablet Take 1 tablet (5 mg total) by mouth daily. 02/01/21  Yes Naquisha Whitehair C, PA-C  amoxicillin-clavulanate (AUGMENTIN) 875-125 MG tablet Take 1 tablet by mouth every 12 (twelve) hours for 7 days. 02/01/21 02/08/21 Yes Cala Kruckenberg C, PA-C  fluticasone (FLONASE) 50 MCG/ACT nasal spray Place 1-2 sprays into both nostrils daily. 02/01/21  Yes Marlean Mortell C, PA-C  ibuprofen (ADVIL) 800 MG tablet Take 1 tablet (800 mg total) by mouth 3 (three) times daily. 07/19/20  Yes Yu, Amy V, PA-C  ibuprofen (ADVIL) 800 MG tablet Take 1 tablet (800 mg total) by mouth every 8 (eight) hours as needed. 09/06/20  Yes Meisinger, Todd, MD  Iron-FA-B Cmp-C-Biot-Probiotic (FUSION PLUS) CAPS Take 1 capsule by mouth daily.   Yes [provider]  Esomeprazole Magnesium (NEXIUM PO) Take by mouth daily.  02/01/21  [provider]  lisinopril (ZESTRIL) 20 MG tablet Take 20 mg by mouth daily.  02/01/21  [provider]    Family History History reviewed. No pertinent family history.  Social History Social History   Tobacco Use  . Smoking status: Never Smoker  . Smokeless tobacco: Never Used  Vaping Use  . Vaping Use: Never used  Substance Use Topics  . Alcohol use: Yes    Comment: social wine once a month  . Drug use: No     Allergies   Tramadol   Review of Systems Review of Systems  Constitutional: Negative for activity change, appetite change, chills, fatigue and fever.  HENT: Positive for congestion, rhinorrhea and sinus pressure. Negative for ear pain, sore throat and trouble swallowing.   Eyes: Negative for discharge and redness.  Respiratory: Negative for cough, chest tightness and shortness of breath.   Cardiovascular: Negative for  chest pain.  Gastrointestinal: Negative for abdominal pain, diarrhea, nausea and vomiting.  Musculoskeletal: Negative for myalgias.  Skin: Negative for rash.  Neurological: Negative for dizziness, light-headedness and headaches.     Physical Exam Triage Vital Signs ED Triage Vitals  Enc Vitals Group     BP      Pulse      Resp      Temp      Temp src      SpO2      Weight      Height      Head Circumference      Peak Flow      Pain Score      Pain Loc      Pain Edu?      Excl. in [place]?    No data found.  Updated Vital Signs BP (!) 163/116 (BP Location: Left Arm)   Pulse 89   Temp 98 F (36.7 C) (Oral)   Resp 18   SpO2 98%   Visual Acuity Right Eye Distance:   Left Eye Distance:   Bilateral Distance:    Right Eye Near:   Left Eye Near:    Bilateral Near:     Physical Exam Vitals and nursing note reviewed.  Constitutional:      Appearance: She is well-developed and well-nourished.     Comments: No acute distress  HENT:     Head: Normocephalic and atraumatic.     Ears:     Comments: Bilateral ears without tenderness to palpation of external auricle, tragus and mastoid, EAC's without erythema or swelling, TM's with good bony landmarks and cone of light. Non erythematous.     Nose: Nose normal.     Mouth/Throat:     Comments: Oral mucosa pink and moist, no tonsillar enlargement or exudate. Posterior pharynx patent and nonerythematous, no uvula deviation or swelling. Normal phonation. Eyes:     Conjunctiva/sclera: Conjunctivae normal.  Cardiovascular:     Rate and Rhythm: Normal rate.  Pulmonary:     Effort: Pulmonary effort is normal. No respiratory distress.     Comments: Breathing comfortably at rest, CTABL, no wheezing, rales or other adventitious sounds auscultated Abdominal:     General: There is no distension.  Musculoskeletal:        General: Normal range of motion.     Cervical back: Neck supple.  Skin:    General: Skin is warm and dry.   Neurological:     Mental Status: She is alert and oriented to person, place, and time.  Psychiatric:        Mood and Affect: Mood and affect normal.      UC Treatments / Results  Labs (all labs ordered are listed, but only abnormal results are displayed) Labs Reviewed - No data to display  EKG   Radiology No results found.  Procedures Procedures (including critical care time)  Medications Ordered in UC Medications - No data  to display  Initial Impression / Assessment and Plan / UC Course  I have reviewed the triage vital signs and the nursing notes.  Pertinent labs & imaging results that were available during my care of the patient were reviewed by me and considered in my medical decision making (see chart for details).     Sinusitis-Augmentin x1 week, continue Flonase and cetirizine for congestion and drainage  Hypertension-refilling amlodipine, provided contact for primary care, discussed monitoring pressure on this medicine.  Discussed strict return precautions. Patient verbalized understanding and is agreeable with plan.  Final Clinical Impressions(s) / UC Diagnoses   Final diagnoses:  Acute sinusitis with symptoms > 10 days  Essential hypertension     Discharge Instructions     Begin Augmentin twice daily for 1 week Flonase daily cetirizine to help with congestion Restart amlodipine Follow-up with primary care Please monitor blood pressure Follow-up if any symptoms not proving or worsening    ED Prescriptions    Medication Sig Dispense Auth. Provider   amLODipine (NORVASC) 5 MG tablet Take 1 tablet (5 mg total) by mouth daily. 60 tablet Febe Champa C, PA-C   amoxicillin-clavulanate (AUGMENTIN) 875-125 MG tablet Take 1 tablet by mouth every 12 (twelve) hours for 7 days. 14 tablet Casimir Barcellos C, PA-C   fluticasone (FLONASE) 50 MCG/ACT nasal spray Place 1-2 sprays into both nostrils daily. 16 g Cieara Stierwalt, Johnson C, PA-C     PDMP not reviewed  this encounter.   Janith Lima, [place] 02/01/21 [phone]

## 2021-02-01 NOTE — ED Triage Notes (Signed)
Patient complains of nasal congestion and drainage x 2 months that has been worsening over the last week. Pt states she normally has seasonal allergies but this si worse. Pt is aox4 and ambulatory. Pt also needs blood pressure meds refilled until she can get into a primary care provider.

## 2021-02-01 NOTE — Discharge Instructions (Signed)
Begin Augmentin twice daily for 1 week Flonase daily cetirizine to help with congestion Restart amlodipine Follow-up with primary care Please monitor blood pressure Follow-up if any symptoms not proving or worsening

## 2021-05-22 NOTE — H&P (Signed)
Patient's anticipated LOS is less than 2 midnights, meeting these requirements: - Younger than 5 - Lives within 1 hour of care - Has a competent adult at home to recover with post-op recover - NO history of  - Chronic pain requiring opiods  - Diabetes  - Coronary Artery Disease  - Heart failure  - Heart attack  - Stroke  - DVT/VTE  - Cardiac arrhythmia  - Respiratory Failure/COPD  - Renal failure  - Anemia  - Advanced Liver disease       Stefanie Moon is an 50 y.o. female.    Chief Complaint: left knee pain  HPI: Pt is a 50 y.o. female complaining of left knee pain for multiple months. Pain had continually increased since the beginning. X-rays in the clinic show meniscal tear left knee. Pt has tried various conservative treatments which have failed to alleviate their symptoms, including injections and therapy. Various options are discussed with the patient. Risks, benefits and expectations were discussed with the patient. Patient understand the risks, benefits and expectations and wishes to proceed with surgery.   PCP:  Patient, No Pcp Per (Inactive)  D/C Plans: Home  PMH: Past Medical History:  Diagnosis Date  . Anemia   . Arthritis    knee  . Blood transfusion without reported diagnosis   . Fibroids   . GERD (gastroesophageal reflux disease)   . Hypertension     PSH: Past Surgical History:  Procedure Laterality Date  . APPENDECTOMY    . CYSTOSCOPY N/A 09/03/2020   Procedure: CYSTOSCOPY;  Surgeon: Cheri Fowler, MD;  Location: [facility];  Service: Gynecology;  Laterality: N/A;  . HYSTERECTOMY ABDOMINAL WITH SALPINGECTOMY Bilateral 09/03/2020   Procedure: HYSTERECTOMY ABDOMINAL WITH SALPINGECTOMY;  Surgeon: Cheri Fowler, MD;  Location: [facility];  Service: Gynecology;  Laterality: Bilateral;  TAP Block    Social History:  reports that she has never smoked. She has never used smokeless tobacco. She reports current alcohol use. She reports that she does not use  drugs.  Allergies:  Allergies  Allergen Reactions  . Tramadol Hives and Rash    Medications: No current facility-administered medications for this encounter.   Current Outpatient Medications  Medication Sig Dispense Refill  . fluticasone (FLONASE) 50 MCG/ACT nasal spray Place 1-2 sprays into both nostrils daily. (Patient taking differently: Place 1-2 sprays into both nostrils daily as needed for allergies.) 16 g 0  . ibuprofen (ADVIL) 200 MG tablet Take 800 mg by mouth every 8 (eight) hours as needed (pain.).    Marland Kitchen Menthol-Methyl Salicylate (MUSCLE RUB) 10-15 % CREA Apply 1 application topically as needed for muscle pain (knee pain.).    Marland Kitchen acetaminophen (TYLENOL) 500 MG tablet Take 500 mg by mouth every 6 (six) hours as needed for mild pain or moderate pain.    Marland Kitchen amLODipine (NORVASC) 5 MG tablet Take 1 tablet (5 mg total) by mouth daily. 60 tablet 0    No results found for this or any previous visit (from the past 48 hour(s)). No results found.  ROS: Pain with rom of the left lower extremity  Physical Exam: Alert and oriented 50 y.o. female in no acute distress Cranial nerves 2-12 intact Cervical spine: full rom with no tenderness, nv intact distally Chest: active breath sounds bilaterally, no wheeze rhonchi or rales Heart: regular rate and rhythm, no murmur Abd: non tender non distended with active bowel sounds Hip is stable with rom  Left knee painful rom with joint line tenderness nv intact distally  Antalgic gait  Assessment/Plan Assessment: left knee meniscal tear  Plan:  Patient will undergo a left knee scope by Dr. Veverly Fells at [facility] and expectations were discussed with the patient. Patient understand risks, benefits and expectations and wishes to proceed. Preoperative templating of the joint replacement has been completed, documented, and submitted to the Operating Room personnel in order to optimize intra-operative equipment management.   Merla Riches PA-C, MPAS [facility] is now [facility] [address] [place], [place], [place] [address] Phone: [phone] [email or web address] Facebook  [employer]

## 2021-05-28 NOTE — Patient Instructions (Signed)
DUE TO COVID-19 ONLY ONE VISITOR IS ALLOWED TO COME WITH YOU AND STAY IN THE WAITING ROOM ONLY DURING PRE OP AND PROCEDURE DAY OF SURGERY. THE 1 VISITOR  MAY VISIT WITH YOU AFTER SURGERY IN YOUR PRIVATE ROOM DURING VISITING HOURS ONLY!               [place]   Your procedure is scheduled on: 05/30/21   Report to [facility] Main  Entrance   Report to admitting at: 1:15 PM     Call this number if you have problems the morning of surgery [phone]    Remember: NO SOLID FOOD AFTER MIDNIGHT THE NIGHT PRIOR TO SURGERY. NOTHING BY MOUTH EXCEPT CLEAR LIQUIDS UNTIL: 12:15 PM . PLEASE FINISH ENSURE DRINK PER SURGEON ORDER  WHICH NEEDS TO BE COMPLETED AT: 12:15 PM .  CLEAR LIQUID DIET  Foods Allowed                                                                     Foods Excluded  Coffee and tea, regular and decaf                             liquids that you cannot  Plain Jell-O any favor except red or purple                                           see through such as: Fruit ices (not with fruit pulp)                                     milk, soups, orange juice  Iced Popsicles                                    All solid food Carbonated beverages, regular and diet                                    Cranberry, grape and apple juices Sports drinks like Gatorade Lightly seasoned clear broth or consume(fat free) Sugar, honey syrup  Sample Menu Breakfast                                Lunch                                     Supper Cranberry juice                    Beef broth                            Chicken broth Jell-O  Grape juice                           Apple juice Coffee or tea                        Jell-O                                      Popsicle                                                Coffee or tea                        Coffee or  tea  _____________________________________________________________________  BRUSH YOUR TEETH MORNING OF SURGERY AND RINSE YOUR MOUTH OUT, NO CHEWING GUM CANDY OR MINTS.   Take these medicines the morning of surgery with A SIP OF WATER: amlodipine,loeratadine.Use flonase as needed.                               You may not have any metal on your body including hair pins and              piercings  Do not wear jewelry, make-up, lotions, powders or perfumes, deodorant             Do not wear nail polish on your fingernails.  Do not shave  48 hours prior to surgery.    Do not bring valuables to the hospital. [place].  Contacts, dentures or bridgework may not be worn into surgery.  Leave suitcase in the car. After surgery it may be brought to your room.     Patients discharged the day of surgery will not be allowed to drive home. IF YOU ARE HAVING SURGERY AND GOING HOME THE SAME DAY, YOU MUST HAVE AN ADULT TO DRIVE YOU HOME AND BE WITH YOU FOR 24 HOURS. YOU MAY GO HOME BY TAXI OR UBER OR ORTHERWISE, BUT AN ADULT MUST ACCOMPANY YOU HOME AND STAY WITH YOU FOR 24 HOURS.  Name and phone number of your driver:  Special Instructions: N/A              Please read over the following fact sheets you were given: _____________________________________________________________________         [facility] - Preparing for Surgery Before surgery, you can play an important role.  Because skin is not sterile, your skin needs to be as free of germs as possible.  You can reduce the number of germs on your skin by washing with CHG (chlorahexidine gluconate) soap before surgery.  CHG is an antiseptic cleaner which kills germs and bonds with the skin to continue killing germs even after washing. Please DO NOT use if you have an allergy to CHG or antibacterial soaps.  If your skin becomes reddened/irritated stop using the CHG and inform your nurse when you arrive at  Short Stay. Do not shave (including legs and underarms) for at least 48 hours prior to the first  CHG shower.  You may shave your face/neck. Please follow these instructions carefully:  1.  Shower with CHG Soap the night before surgery and the  morning of Surgery.  2.  If you choose to wash your hair, wash your hair first as usual with your  normal  shampoo.  3.  After you shampoo, rinse your hair and body thoroughly to remove the  shampoo.                           4.  Use CHG as you would any other liquid soap.  You can apply chg directly  to the skin and wash                       Gently with a scrungie or clean washcloth.  5.  Apply the CHG Soap to your body ONLY FROM THE NECK DOWN.   Do not use on face/ open                           Wound or open sores. Avoid contact with eyes, ears mouth and genitals (private parts).                       Wash face,  Genitals (private parts) with your normal soap.             6.  Wash thoroughly, paying special attention to the area where your surgery  will be performed.  7.  Thoroughly rinse your body with warm water from the neck down.  8.  DO NOT shower/wash with your normal soap after using and rinsing off  the CHG Soap.                9.  Pat yourself dry with a clean towel.            10.  Wear clean pajamas.            11.  Place clean sheets on your bed the night of your first shower and do not  sleep with pets. Day of Surgery : Do not apply any lotions/deodorants the morning of surgery.  Please wear clean clothes to the hospital/surgery center.  FAILURE TO FOLLOW THESE INSTRUCTIONS MAY RESULT IN THE CANCELLATION OF YOUR SURGERY PATIENT SIGNATURE_________________________________  NURSE SIGNATURE__________________________________  ________________________________________________________________________   Stefanie Moon  An incentive spirometer is a tool that can help keep your lungs clear and active. This tool measures how well you are  filling your lungs with each breath. Taking long deep breaths may help reverse or decrease the chance of developing breathing (pulmonary) problems (especially infection) following:  A long period of time when you are unable to move or be active. BEFORE THE PROCEDURE   If the spirometer includes an indicator to show your best effort, your nurse or respiratory therapist will set it to a desired goal.  If possible, sit up straight or lean slightly forward. Try not to slouch.  Hold the incentive spirometer in an upright position. INSTRUCTIONS FOR USE  1. Sit on the edge of your bed if possible, or sit up as far as you can in bed or on a chair. 2. Hold the incentive spirometer in an upright position. 3. Breathe out normally. 4. Place the mouthpiece in your mouth and seal your lips tightly around it. 5. Breathe in slowly and as deeply  as possible, raising the piston or the ball toward the top of the column. 6. Hold your breath for 3-5 seconds or for as long as possible. Allow the piston or ball to fall to the bottom of the column. 7. Remove the mouthpiece from your mouth and breathe out normally. 8. Rest for a few seconds and repeat Steps 1 through 7 at least 10 times every 1-2 hours when you are awake. Take your time and take a few normal breaths between deep breaths. 9. The spirometer may include an indicator to show your best effort. Use the indicator as a goal to work toward during each repetition. 10. After each set of 10 deep breaths, practice coughing to be sure your lungs are clear. If you have an incision (the cut made at the time of surgery), support your incision when coughing by placing a pillow or rolled up towels firmly against it. Once you are able to get out of bed, walk around indoors and cough well. You may stop using the incentive spirometer when instructed by your caregiver.  RISKS AND COMPLICATIONS  Take your time so you do not get dizzy or light-headed.  If you are in pain,  you may need to take or ask for pain medication before doing incentive spirometry. It is harder to take a deep breath if you are having pain. AFTER USE  Rest and breathe slowly and easily.  It can be helpful to keep track of a log of your progress. Your caregiver can provide you with a simple table to help with this. If you are using the spirometer at home, follow these instructions: [place] IF:   You are having difficultly using the spirometer.  You have trouble using the spirometer as often as instructed.  Your pain medication is not giving enough relief while using the spirometer.  You develop fever of 100.5 F (38.1 C) or higher. SEEK IMMEDIATE MEDICAL CARE IF:   You cough up bloody sputum that had not been present before.  You develop fever of 102 F (38.9 C) or greater.  You develop worsening pain at or near the incision site. MAKE SURE YOU:   Understand these instructions.  Will watch your condition.  Will get help right away if you are not doing well or get worse. Document Released: 04/26/2007 Document Revised: 03/07/2012 Document Reviewed: 06/27/2007 [facility] Patient Information 2014 [place], [place].   ________________________________________________________________________

## 2021-05-29 ENCOUNTER — Other Ambulatory Visit (HOSPITAL_COMMUNITY): Payer: Self-pay | Admitting: Orthopedic Surgery

## 2021-05-29 ENCOUNTER — Encounter (HOSPITAL_COMMUNITY)
Admission: RE | Admit: 2021-05-29 | Discharge: 2021-05-29 | Disposition: A | Discharge status: Home / Self Care | Payer: 59 | Source: Ambulatory Visit | Attending: Orthopedic Surgery | Admitting: Orthopedic Surgery

## 2021-05-29 ENCOUNTER — Encounter (HOSPITAL_COMMUNITY): Payer: Self-pay

## 2021-05-29 ENCOUNTER — Encounter (HOSPITAL_COMMUNITY): Payer: Self-pay | Admitting: Physician Assistant

## 2021-05-29 ENCOUNTER — Other Ambulatory Visit: Payer: Self-pay

## 2021-05-29 DIAGNOSIS — Z01818 Encounter for other preprocedural examination: Secondary | ICD-10-CM | POA: Insufficient documentation

## 2021-05-29 DIAGNOSIS — M79662 Pain in left lower leg: Secondary | ICD-10-CM

## 2021-05-29 DIAGNOSIS — M7989 Other specified soft tissue disorders: Secondary | ICD-10-CM

## 2021-05-29 LAB — BASIC METABOLIC PANEL
Anion gap: 7 (ref 5–15)
BUN: 13 mg/dL (ref 6–20)
CO2: 26 mmol/L (ref 22–32)
Calcium: 8.7 mg/dL — ABNORMAL LOW (ref 8.9–10.3)
Chloride: 102 mmol/L (ref 98–111)
Creatinine, Ser: 0.86 mg/dL (ref 0.44–1.00)
GFR, Estimated: >60 mL/min (ref >60)
Glucose, Bld: 95 mg/dL (ref 70–99)
Potassium: 3.7 mmol/L (ref 3.5–5.1)
Sodium: 135 mmol/L (ref 135–145)

## 2021-05-29 LAB — CBC
HCT: 39.2 % (ref 36.0–46.0)
Hemoglobin: 12.5 g/dL (ref 12.0–15.0)
MCH: 27 pg (ref 26.0–34.0)
MCHC: 31.9 g/dL (ref 30.0–36.0)
MCV: 84.7 fL (ref 80.0–100.0)
Platelets: 236 10*3/uL (ref 150–400)
RBC: 4.63 MIL/uL (ref 3.87–5.11)
RDW: 14.6 % (ref 11.5–15.5)
WBC: 5.2 10*3/uL (ref 4.0–10.5)
nRBC: 0 % (ref 0.0–0.2)

## 2021-05-29 NOTE — Progress Notes (Signed)
COVID Vaccine Completed: NO Date COVID Vaccine completed: COVID vaccine manufacturer: [place]   PCP - NO Cardiologist -   Chest x-ray -  EKG -  Stress Test -  ECHO -  Cardiac Cath -  Pacemaker/ICD device last checked:  Sleep Study -  CPAP -   Fasting Blood Sugar -  Checks Blood Sugar _____ times a day  Blood Thinner Instructions: Aspirin Instructions: Last Dose:  Anesthesia review: Hx: HTN  Patient denies shortness of breath, fever, cough and chest pain at PAT appointment   Patient verbalized understanding of instructions that were given to them at the PAT appointment. Patient was also instructed that they will need to review over the PAT instructions again at home before surgery.

## 2021-05-29 NOTE — Progress Notes (Deleted)
Lab. Results: Hemoglobin: 8.2 HCT: 27.3

## 2021-05-30 ENCOUNTER — Ambulatory Visit (HOSPITAL_COMMUNITY)
Admission: RE | Admit: 2021-05-30 | Discharge: 2021-05-30 | Disposition: A | Discharge status: Home / Self Care | Payer: 59 | Source: Ambulatory Visit | Attending: Vascular Surgery | Admitting: Vascular Surgery

## 2021-05-30 ENCOUNTER — Encounter (HOSPITAL_COMMUNITY): Admission: RE | Payer: Self-pay | Source: Ambulatory Visit

## 2021-05-30 ENCOUNTER — Ambulatory Visit (HOSPITAL_COMMUNITY): Admission: RE | Admit: 2021-05-30 | Payer: 59 | Source: Ambulatory Visit | Admitting: Orthopedic Surgery

## 2021-05-30 DIAGNOSIS — M25562 Pain in left knee: Secondary | ICD-10-CM | POA: Insufficient documentation

## 2021-05-30 DIAGNOSIS — M79662 Pain in left lower leg: Secondary | ICD-10-CM | POA: Diagnosis not present

## 2021-05-30 DIAGNOSIS — M7989 Other specified soft tissue disorders: Secondary | ICD-10-CM | POA: Diagnosis not present

## 2021-05-30 DIAGNOSIS — R600 Localized edema: Secondary | ICD-10-CM | POA: Insufficient documentation

## 2021-05-30 SURGERY — ARTHROSCOPY, KNEE
Anesthesia: Choice | Site: Knee | Laterality: Left

## 2021-05-30 NOTE — Progress Notes (Signed)
Lower extremity venous has been completed.   Preliminary results in CV Proc.   Jinny Blossom Kirby Cortese 05/30/2021 8:16 AM

## 2021-06-19 NOTE — Patient Instructions (Signed)
DUE TO COVID-19 ONLY ONE VISITOR IS ALLOWED TO COME WITH YOU AND STAY IN THE WAITING ROOM ONLY DURING PRE OP AND PROCEDURE DAY OF SURGERY. THE 1 VISITOR  MAY VISIT WITH YOU AFTER SURGERY IN YOUR PRIVATE ROOM DURING VISITING HOURS ONLY!               [place]   Your procedure is scheduled on: 07/04/21   Report to [facility] Main  Entrance   Report to admitting at : 3:30 PM     Call this number if you have problems the morning of surgery [phone]    Remember: Do not eat solid food :After Midnight. Clear liquids until: 2:30 PM  CLEAR LIQUID DIET  Foods Allowed                                                                     Foods Excluded  Coffee and tea, regular and decaf                             liquids that you cannot  Plain Jell-O any favor except red or purple                                           see through such as: Fruit ices (not with fruit pulp)                                     milk, soups, orange juice  Iced Popsicles                                    All solid food Carbonated beverages, regular and diet                                    Cranberry, grape and apple juices Sports drinks like Gatorade Lightly seasoned clear broth or consume(fat free) Sugar, honey syrup  Sample Menu Breakfast                                Lunch                                     Supper Cranberry juice                    Beef broth                            Chicken broth Jell-O  Grape juice                           Apple juice Coffee or tea                        Jell-O                                      Popsicle                                                Coffee or tea                        Coffee or tea  _____________________________________________________________________   BRUSH YOUR TEETH MORNING OF SURGERY AND RINSE YOUR MOUTH OUT, NO CHEWING GUM CANDY OR MINTS.    Take these medicines the morning of  surgery with A SIP OF WATER: amlodipine,loratadine.                               You may not have any metal on your body including hair pins and              piercings  Do not wear jewelry, make-up, lotions, powders or perfumes, deodorant             Do not wear nail polish on your fingernails.  Do not shave  48 hours prior to surgery.    Do not bring valuables to the hospital. [place].  Contacts, dentures or bridgework may not be worn into surgery.  Leave suitcase in the car. After surgery it may be brought to your room.     Patients discharged the day of surgery will not be allowed to drive home. IF YOU ARE HAVING SURGERY AND GOING HOME THE SAME DAY, YOU MUST HAVE AN ADULT TO DRIVE YOU HOME AND BE WITH YOU FOR 24 HOURS. YOU MAY GO HOME BY TAXI OR UBER OR ORTHERWISE, BUT AN ADULT MUST ACCOMPANY YOU HOME AND STAY WITH YOU FOR 24 HOURS.  Name and phone number of your driver:  Special Instructions: N/A              Please read over the following fact sheets you were given: _____________________________________________________________________           [facility] - Preparing for Surgery Before surgery, you can play an important role.  Because skin is not sterile, your skin needs to be as free of germs as possible.  You can reduce the number of germs on your skin by washing with CHG (chlorahexidine gluconate) soap before surgery.  CHG is an antiseptic cleaner which kills germs and bonds with the skin to continue killing germs even after washing. Please DO NOT use if you have an allergy to CHG or antibacterial soaps.  If your skin becomes reddened/irritated stop using the CHG and inform your nurse when you arrive at Short Stay. Do not shave (including legs and underarms) for at least 48 hours prior to the first  CHG shower.  You may shave your face/neck. Please follow these instructions carefully:  1.  Shower with CHG Soap the night before  surgery and the  morning of Surgery.  2.  If you choose to wash your hair, wash your hair first as usual with your  normal  shampoo.  3.  After you shampoo, rinse your hair and body thoroughly to remove the  shampoo.                           4.  Use CHG as you would any other liquid soap.  You can apply chg directly  to the skin and wash                       Gently with a scrungie or clean washcloth.  5.  Apply the CHG Soap to your body ONLY FROM THE NECK DOWN.   Do not use on face/ open                           Wound or open sores. Avoid contact with eyes, ears mouth and genitals (private parts).                       Wash face,  Genitals (private parts) with your normal soap.             6.  Wash thoroughly, paying special attention to the area where your surgery  will be performed.  7.  Thoroughly rinse your body with warm water from the neck down.  8.  DO NOT shower/wash with your normal soap after using and rinsing off  the CHG Soap.                9.  Pat yourself dry with a clean towel.            10.  Wear clean pajamas.            11.  Place clean sheets on your bed the night of your first shower and do not  sleep with pets. Day of Surgery : Do not apply any lotions/deodorants the morning of surgery.  Please wear clean clothes to the hospital/surgery center.  FAILURE TO FOLLOW THESE INSTRUCTIONS MAY RESULT IN THE CANCELLATION OF YOUR SURGERY PATIENT SIGNATURE_________________________________  NURSE SIGNATURE__________________________________  ________________________________________________________________________   Stefanie Moon  An incentive spirometer is a tool that can help keep your lungs clear and active. This tool measures how well you are filling your lungs with each breath. Taking long deep breaths may help reverse or decrease the chance of developing breathing (pulmonary) problems (especially infection) following: A long period of time when you are unable to  move or be active. BEFORE THE PROCEDURE  If the spirometer includes an indicator to show your best effort, your nurse or respiratory therapist will set it to a desired goal. If possible, sit up straight or lean slightly forward. Try not to slouch. Hold the incentive spirometer in an upright position. INSTRUCTIONS FOR USE  Sit on the edge of your bed if possible, or sit up as far as you can in bed or on a chair. Hold the incentive spirometer in an upright position. Breathe out normally. Place the mouthpiece in your mouth and seal your lips tightly around it. Breathe in slowly and as deeply as possible, raising the piston or the ball  toward the top of the column. Hold your breath for 3-5 seconds or for as long as possible. Allow the piston or ball to fall to the bottom of the column. Remove the mouthpiece from your mouth and breathe out normally. Rest for a few seconds and repeat Steps 1 through 7 at least 10 times every 1-2 hours when you are awake. Take your time and take a few normal breaths between deep breaths. The spirometer may include an indicator to show your best effort. Use the indicator as a goal to work toward during each repetition. After each set of 10 deep breaths, practice coughing to be sure your lungs are clear. If you have an incision (the cut made at the time of surgery), support your incision when coughing by placing a pillow or rolled up towels firmly against it. Once you are able to get out of bed, walk around indoors and cough well. You may stop using the incentive spirometer when instructed by your caregiver.  RISKS AND COMPLICATIONS Take your time so you do not get dizzy or light-headed. If you are in pain, you may need to take or ask for pain medication before doing incentive spirometry. It is harder to take a deep breath if you are having pain. AFTER USE Rest and breathe slowly and easily. It can be helpful to keep track of a log of your progress. Your caregiver can  provide you with a simple table to help with this. If you are using the spirometer at home, follow these instructions: [place] IF:  You are having difficultly using the spirometer. You have trouble using the spirometer as often as instructed. Your pain medication is not giving enough relief while using the spirometer. You develop fever of 100.5 F (38.1 C) or higher. SEEK IMMEDIATE MEDICAL CARE IF:  You cough up bloody sputum that had not been present before. You develop fever of 102 F (38.9 C) or greater. You develop worsening pain at or near the incision site. MAKE SURE YOU:  Understand these instructions. Will watch your condition. Will get help right away if you are not doing well or get worse. Document Released: 04/26/2007 Document Revised: 03/07/2012 Document Reviewed: 06/27/2007 [facility] Patient Information 2014 [place], [place].   ________________________________________________________________________

## 2021-06-20 ENCOUNTER — Encounter (HOSPITAL_COMMUNITY): Payer: Self-pay

## 2021-06-20 ENCOUNTER — Other Ambulatory Visit: Payer: Self-pay

## 2021-06-20 ENCOUNTER — Encounter (HOSPITAL_COMMUNITY)
Admission: RE | Admit: 2021-06-20 | Discharge: 2021-06-20 | Disposition: A | Discharge status: Home / Self Care | Payer: 59 | Source: Ambulatory Visit | Attending: Orthopedic Surgery | Admitting: Orthopedic Surgery

## 2021-06-20 DIAGNOSIS — Z01812 Encounter for preprocedural laboratory examination: Secondary | ICD-10-CM | POA: Diagnosis not present

## 2021-06-20 LAB — CBC
HCT: 41.1 % (ref 36.0–46.0)
Hemoglobin: 13.1 g/dL (ref 12.0–15.0)
MCH: 27.9 pg (ref 26.0–34.0)
MCHC: 31.9 g/dL (ref 30.0–36.0)
MCV: 87.4 fL (ref 80.0–100.0)
Platelets: 247 10*3/uL (ref 150–400)
RBC: 4.7 MIL/uL (ref 3.87–5.11)
RDW: 15.3 % (ref 11.5–15.5)
WBC: 8.5 10*3/uL (ref 4.0–10.5)
nRBC: 0 % (ref 0.0–0.2)

## 2021-06-20 LAB — BASIC METABOLIC PANEL
Anion gap: 7 (ref 5–15)
BUN: 12 mg/dL (ref 6–20)
CO2: 28 mmol/L (ref 22–32)
Calcium: 9 mg/dL (ref 8.9–10.3)
Chloride: 102 mmol/L (ref 98–111)
Creatinine, Ser: 0.92 mg/dL (ref 0.44–1.00)
GFR, Estimated: >60 mL/min (ref >60)
Glucose, Bld: 99 mg/dL (ref 70–99)
Potassium: 3.8 mmol/L (ref 3.5–5.1)
Sodium: 137 mmol/L (ref 135–145)

## 2021-06-20 NOTE — Progress Notes (Signed)
COVID Vaccine Completed: NO Date COVID Vaccine completed: COVID vaccine manufacturer: [place]   PCP - No PCP Cardiologist -   Chest x-ray -  EKG - 05/29/21 EPIC Stress Test -  ECHO -  Cardiac Cath -  Pacemaker/ICD device last checked:  Sleep Study -  CPAP -   Fasting Blood Sugar -  Checks Blood Sugar _____ times a day  Blood Thinner Instructions: Aspirin Instructions: Last Dose:  Anesthesia review:   Patient denies shortness of breath, fever, cough and chest pain at PAT appointment   Patient verbalized understanding of instructions that were given to them at the PAT appointment. Patient was also instructed that they will need to review over the PAT instructions again at home before surgery.

## 2021-06-27 ENCOUNTER — Other Ambulatory Visit: Payer: Self-pay

## 2021-06-27 ENCOUNTER — Encounter (HOSPITAL_COMMUNITY): Payer: Self-pay | Admitting: Orthopedic Surgery

## 2021-06-27 NOTE — Progress Notes (Signed)
Spoke with pt for pre-op call. Pt is treated for HTN. Pt states she is not diabetic.   Pt's surgery is scheduled as ambulatory so no Covid test is required prior to surgery. Pt denies any recent symptoms of Covid.

## 2021-07-01 NOTE — H&P (Signed)
Patient's anticipated LOS is less than 2 midnights, meeting these requirements: - Younger than 13 - Lives within 1 hour of care - Has a competent adult at home to recover with post-op recover - NO history of  - Chronic pain requiring opiods  - Diabetes  - Coronary Artery Disease  - Heart failure  - Heart attack  - Stroke  - DVT/VTE  - Cardiac arrhythmia  - Respiratory Failure/COPD  - Renal failure  - Anemia  - Advanced Liver disease     Stefanie Moon is an 50 y.o. female.    Chief Complaint: left knee pain  HPI: Pt is a 50 y.o. female complaining of left knee pain for multiple years. Pain had continually increased since the beginning. X-rays in the clinic show meniscal tear left knee. Pt has tried various conservative treatments which have failed to alleviate their symptoms, including injections and therapy. Various options are discussed with the patient. Risks, benefits and expectations were discussed with the patient. Patient understand the risks, benefits and expectations and wishes to proceed with surgery.   PCP:  Patient, No Pcp Per (Inactive)  D/C Plans: Home  PMH: Past Medical History:  Diagnosis Date   Anemia    Arthritis    knee   Blood transfusion without reported diagnosis    Fibroids    GERD (gastroesophageal reflux disease)    Hypertension     PSH: Past Surgical History:  Procedure Laterality Date   APPENDECTOMY     CYSTOSCOPY N/A 09/03/2020   Procedure: CYSTOSCOPY;  Surgeon: Cheri Fowler, MD;  Location: [facility];  Service: Gynecology;  Laterality: N/A;   HYSTERECTOMY ABDOMINAL WITH SALPINGECTOMY Bilateral 09/03/2020   Procedure: HYSTERECTOMY ABDOMINAL WITH SALPINGECTOMY;  Surgeon: Cheri Fowler, MD;  Location: [facility];  Service: Gynecology;  Laterality: Bilateral;  TAP Block    Social History:  reports that she has never smoked. She has never used smokeless tobacco. She reports current alcohol use. She reports that she does not use  drugs.  Allergies:  Allergies  Allergen Reactions   Tramadol Hives and Rash    Medications: No current facility-administered medications for this encounter.   Current Outpatient Medications  Medication Sig Dispense Refill   acidophilus (RISAQUAD) CAPS capsule Take 1 capsule by mouth daily.     amLODipine (NORVASC) 5 MG tablet Take 1 tablet (5 mg total) by mouth daily. 60 tablet 0   diclofenac Sodium (VOLTAREN) 1 % GEL Apply 2 g topically 2 (two) times daily as needed (pain).     diphenhydrAMINE (BENADRYL) 25 MG tablet Take 25 mg by mouth at bedtime.     fluticasone (FLONASE) 50 MCG/ACT nasal spray Place 1-2 sprays into both nostrils daily. (Patient taking differently: Place 1-2 sprays into both nostrils daily as needed for allergies.) 16 g 0   ibuprofen (ADVIL) 200 MG tablet Take 800 mg by mouth every 8 (eight) hours as needed (pain.).     loratadine (CLARITIN) 10 MG tablet Take 10 mg by mouth daily.      No results found for this or any previous visit (from the past 48 hour(s)). No results found.  ROS: Pain with rom of the left lower extremity  Physical Exam: Alert and oriented 50 y.o. female in no acute distress Cranial nerves 2-12 intact Cervical spine: full rom with no tenderness, nv intact distally Chest: active breath sounds bilaterally, no wheeze rhonchi or rales Heart: regular rate and rhythm, no murmur Abd: non tender non distended with active bowel sounds Hip  is stable with rom  Left knee pain to medial joint line Antalgic gait No rashes or edema  Assessment/Plan Assessment: left knee meniscal tear  Plan:  Patient will undergo a left knee scope by Dr. Veverly Fells at [facility] Risks benefits and expectations were discussed with the patient. Patient understand risks, benefits and expectations and wishes to proceed. Preoperative templating of the joint replacement has been completed, documented, and submitted to the Operating Room personnel in order to optimize  intra-operative equipment management.   Merla Riches PA-C, MPAS [facility] is now [employer] [address] [place], [place], [place] [address] Phone: [phone] [email or web address] Facebook  [employer]

## 2021-07-02 ENCOUNTER — Ambulatory Visit (HOSPITAL_COMMUNITY): Payer: 59 | Admitting: Anesthesiology

## 2021-07-02 ENCOUNTER — Ambulatory Visit (HOSPITAL_COMMUNITY)
Admission: RE | Admit: 2021-07-02 | Discharge: 2021-07-02 | Disposition: A | Discharge status: Home / Self Care | Payer: 59 | Source: Other Acute Inpatient Hospital | Attending: Orthopedic Surgery | Admitting: Orthopedic Surgery

## 2021-07-02 ENCOUNTER — Encounter (HOSPITAL_COMMUNITY)
Admission: RE | Disposition: A | Discharge status: Home / Self Care | Payer: Self-pay | Source: Other Acute Inpatient Hospital | Attending: Orthopedic Surgery

## 2021-07-02 ENCOUNTER — Encounter (HOSPITAL_COMMUNITY): Payer: Self-pay | Admitting: Orthopedic Surgery

## 2021-07-02 ENCOUNTER — Other Ambulatory Visit: Payer: Self-pay

## 2021-07-02 DIAGNOSIS — S83242A Other tear of medial meniscus, current injury, left knee, initial encounter: Secondary | ICD-10-CM | POA: Diagnosis present

## 2021-07-02 DIAGNOSIS — M2242 Chondromalacia patellae, left knee: Secondary | ICD-10-CM | POA: Insufficient documentation

## 2021-07-02 DIAGNOSIS — Z885 Allergy status to narcotic agent status: Secondary | ICD-10-CM | POA: Diagnosis not present

## 2021-07-02 DIAGNOSIS — Z79899 Other long term (current) drug therapy: Secondary | ICD-10-CM | POA: Insufficient documentation

## 2021-07-02 DIAGNOSIS — S83252A Bucket-handle tear of lateral meniscus, current injury, left knee, initial encounter: Secondary | ICD-10-CM | POA: Diagnosis not present

## 2021-07-02 DIAGNOSIS — X58XXXA Exposure to other specified factors, initial encounter: Secondary | ICD-10-CM | POA: Insufficient documentation

## 2021-07-02 HISTORY — PX: KNEE ARTHROSCOPY: SHX127

## 2021-07-02 SURGERY — ARTHROSCOPY, KNEE
Anesthesia: General | Site: Knee | Laterality: Left

## 2021-07-02 MED ORDER — LABETALOL HCL 5 MG/ML IV SOLN
5.0000 mg | Freq: Once | INTRAVENOUS | Status: AC
  Administered 2021-07-02: 5 mg via INTRAVENOUS

## 2021-07-02 MED ORDER — MIDAZOLAM HCL 2 MG/2ML IJ SOLN
INTRAMUSCULAR | Status: AC
  Filled 2021-07-02: qty 2

## 2021-07-02 MED ORDER — ACETAMINOPHEN 500 MG PO TABS
1000.0000 mg | ORAL_TABLET | Freq: Once | ORAL | Status: AC
  Administered 2021-07-02: 1000 mg via ORAL

## 2021-07-02 MED ORDER — HYDRALAZINE HCL 20 MG/ML IJ SOLN
INTRAMUSCULAR | Status: AC
  Filled 2021-07-02: qty 1

## 2021-07-02 MED ORDER — LACTATED RINGERS IV SOLN: INTRAVENOUS | Status: DC

## 2021-07-02 MED ORDER — BUPIVACAINE-EPINEPHRINE 0.5% -1:200000 IJ SOLN
INTRAMUSCULAR | Status: DC | PRN
  Administered 2021-07-02: 27 mL

## 2021-07-02 MED ORDER — ORAL CARE MOUTH RINSE: 15.0000 mL | Freq: Once | OROMUCOSAL | Status: AC

## 2021-07-02 MED ORDER — LIDOCAINE 2% (20 MG/ML) 5 ML SYRINGE
INTRAMUSCULAR | Status: AC
  Filled 2021-07-02: qty 5

## 2021-07-02 MED ORDER — ONDANSETRON HCL 4 MG/2ML IJ SOLN
INTRAMUSCULAR | Status: AC
  Filled 2021-07-02: qty 2

## 2021-07-02 MED ORDER — FENTANYL CITRATE (PF) 250 MCG/5ML IJ SOLN
INTRAMUSCULAR | Status: AC
  Filled 2021-07-02: qty 5

## 2021-07-02 MED ORDER — CHLORHEXIDINE GLUCONATE 0.12 % MT SOLN: 15.0000 mL | Freq: Once | OROMUCOSAL | Status: AC

## 2021-07-02 MED ORDER — ASPIRIN 81 MG PO CHEW: 81.0000 mg | CHEWABLE_TABLET | Freq: Two times a day (BID) | ORAL | 0 refills | Status: AC

## 2021-07-02 MED ORDER — HYDRALAZINE HCL 20 MG/ML IJ SOLN
10.0000 mg | Freq: Once | INTRAMUSCULAR | Status: AC
  Administered 2021-07-02: 10 mg via INTRAVENOUS

## 2021-07-02 MED ORDER — FENTANYL CITRATE (PF) 100 MCG/2ML IJ SOLN
25.0000 ug | INTRAMUSCULAR | Status: DC | PRN
  Administered 2021-07-02: 25 ug via INTRAVENOUS
  Administered 2021-07-02: 50 ug via INTRAVENOUS
  Administered 2021-07-02: 25 ug via INTRAVENOUS

## 2021-07-02 MED ORDER — LABETALOL HCL 5 MG/ML IV SOLN
INTRAVENOUS | Status: AC
  Filled 2021-07-02: qty 4

## 2021-07-02 MED ORDER — CEFAZOLIN SODIUM-DEXTROSE 2-4 GM/100ML-% IV SOLN
INTRAVENOUS | Status: AC
  Filled 2021-07-02: qty 100

## 2021-07-02 MED ORDER — PROPOFOL 10 MG/ML IV BOLUS
INTRAVENOUS | Status: AC
  Filled 2021-07-02: qty 40

## 2021-07-02 MED ORDER — KETOROLAC TROMETHAMINE 30 MG/ML IJ SOLN
INTRAMUSCULAR | Status: DC | PRN
  Administered 2021-07-02: 30 mg via INTRAVENOUS

## 2021-07-02 MED ORDER — FENTANYL CITRATE (PF) 250 MCG/5ML IJ SOLN
INTRAMUSCULAR | Status: DC | PRN
  Administered 2021-07-02: 100 ug via INTRAVENOUS
  Administered 2021-07-02 (×5): 25 ug via INTRAVENOUS
  Administered 2021-07-02: 50 ug via INTRAVENOUS
  Administered 2021-07-02: 25 ug via INTRAVENOUS

## 2021-07-02 MED ORDER — ACETAMINOPHEN 500 MG PO TABS
ORAL_TABLET | ORAL | Status: AC
  Filled 2021-07-02: qty 2

## 2021-07-02 MED ORDER — ONDANSETRON HCL 4 MG PO TABS: 4.0000 mg | ORAL_TABLET | Freq: Three times a day (TID) | ORAL | 0 refills | Status: DC | PRN

## 2021-07-02 MED ORDER — FENTANYL CITRATE (PF) 100 MCG/2ML IJ SOLN
INTRAMUSCULAR | Status: AC
  Filled 2021-07-02: qty 2

## 2021-07-02 MED ORDER — CEFAZOLIN SODIUM-DEXTROSE 2-4 GM/100ML-% IV SOLN
2.0000 g | INTRAVENOUS | Status: AC
  Administered 2021-07-02: 2 g via INTRAVENOUS

## 2021-07-02 MED ORDER — PROPOFOL 10 MG/ML IV BOLUS
INTRAVENOUS | Status: DC | PRN
  Administered 2021-07-02: 50 mg via INTRAVENOUS
  Administered 2021-07-02: 150 mg via INTRAVENOUS

## 2021-07-02 MED ORDER — BUPIVACAINE-EPINEPHRINE 0.5% -1:200000 IJ SOLN
INTRAMUSCULAR | Status: AC
  Filled 2021-07-02: qty 1

## 2021-07-02 MED ORDER — ONDANSETRON HCL 4 MG/2ML IJ SOLN
INTRAMUSCULAR | Status: DC | PRN
  Administered 2021-07-02: 4 mg via INTRAVENOUS

## 2021-07-02 MED ORDER — METHOCARBAMOL 500 MG PO TABS: 500.0000 mg | ORAL_TABLET | Freq: Four times a day (QID) | ORAL | 1 refills | Status: DC | PRN

## 2021-07-02 MED ORDER — LIDOCAINE 2% (20 MG/ML) 5 ML SYRINGE
INTRAMUSCULAR | Status: DC | PRN
  Administered 2021-07-02: 20 mg via INTRAVENOUS

## 2021-07-02 MED ORDER — DEXAMETHASONE SODIUM PHOSPHATE 10 MG/ML IJ SOLN
INTRAMUSCULAR | Status: AC
  Filled 2021-07-02: qty 1

## 2021-07-02 MED ORDER — CHLORHEXIDINE GLUCONATE 0.12 % MT SOLN
OROMUCOSAL | Status: AC
  Administered 2021-07-02: 15 mL via OROMUCOSAL
  Filled 2021-07-02: qty 15

## 2021-07-02 MED ORDER — OXYCODONE-ACETAMINOPHEN 5-325 MG PO TABS
1.0000 | ORAL_TABLET | Freq: Once | ORAL | Status: AC
  Administered 2021-07-02: 1 via ORAL

## 2021-07-02 MED ORDER — OXYCODONE-ACETAMINOPHEN 5-325 MG PO TABS: 1.0000 | ORAL_TABLET | ORAL | 0 refills | Status: DC | PRN

## 2021-07-02 MED ORDER — OXYCODONE-ACETAMINOPHEN 5-325 MG PO TABS
ORAL_TABLET | ORAL | Status: AC
  Filled 2021-07-02: qty 1

## 2021-07-02 MED ORDER — MIDAZOLAM HCL 2 MG/2ML IJ SOLN
INTRAMUSCULAR | Status: DC | PRN
  Administered 2021-07-02: 2 mg via INTRAVENOUS

## 2021-07-02 MED ORDER — DEXAMETHASONE SODIUM PHOSPHATE 10 MG/ML IJ SOLN
INTRAMUSCULAR | Status: DC | PRN
  Administered 2021-07-02: 10 mg via INTRAVENOUS

## 2021-07-02 MED ORDER — SODIUM CHLORIDE 0.9 % IR SOLN
Status: DC | PRN
  Administered 2021-07-02: 6000 mL

## 2021-07-02 SURGICAL SUPPLY — 34 items
BAG COUNTER SPONGE SURGICOUNT (BAG) ×2 IMPLANT
BINDER ABDOMINAL 12 XL 75-84 (SOFTGOODS) ×2 IMPLANT
BLADE CUTTER GATOR 3.5 (BLADE) ×2 IMPLANT
BLADE SURG 11 STRL SS (BLADE) ×2 IMPLANT
BNDG COHESIVE 6X5 TAN STRL LF (GAUZE/BANDAGES/DRESSINGS) ×2 IMPLANT
BNDG ELASTIC 6X10 VLCR STRL LF (GAUZE/BANDAGES/DRESSINGS) ×2 IMPLANT
BNDG GAUZE ELAST 4 BULKY (GAUZE/BANDAGES/DRESSINGS) ×2 IMPLANT
DRAPE ARTHROSCOPY W/POUCH 114 (DRAPES) ×2 IMPLANT
DURAPREP 26ML APPLICATOR (WOUND CARE) ×2 IMPLANT
ELECT MENISCUS 165MM 90D (ELECTRODE) IMPLANT
ELECT REM PT RETURN 9FT ADLT (ELECTROSURGICAL) ×2
ELECTRODE REM PT RTRN 9FT ADLT (ELECTROSURGICAL) ×1 IMPLANT
GAUZE SPONGE 4X4 12PLY STRL (GAUZE/BANDAGES/DRESSINGS) ×2 IMPLANT
GLOVE SURG ORTHO LTX SZ8.5 (GLOVE) ×2 IMPLANT
GLOVE SURG POLY ORTHO LF SZ8 (GLOVE) ×2 IMPLANT
GOWN STRL REUS W/ TWL XL LVL3 (GOWN DISPOSABLE) ×2 IMPLANT
GOWN STRL REUS W/TWL XL LVL3 (GOWN DISPOSABLE) ×2
KIT BASIN OR (CUSTOM PROCEDURE TRAY) ×2 IMPLANT
KIT TURNOVER KIT B (KITS) ×2 IMPLANT
MANIFOLD NEPTUNE II (INSTRUMENTS) ×2 IMPLANT
NEEDLE SPNL 18GX3.5 QUINCKE PK (NEEDLE) ×2 IMPLANT
PACK ARTHROSCOPY DSU (CUSTOM PROCEDURE TRAY) ×2 IMPLANT
PAD ARMBOARD 7.5X6 YLW CONV (MISCELLANEOUS) ×4 IMPLANT
PENCIL BUTTON HOLSTER BLD 10FT (ELECTRODE) IMPLANT
PORT APPOLLO RF 90DEGREE MULTI (SURGICAL WAND) IMPLANT
SPONGE T-LAP 18X18 ~~LOC~~+RFID (SPONGE) ×2 IMPLANT
STRIP CLOSURE SKIN 1/2X4 (GAUZE/BANDAGES/DRESSINGS) ×2 IMPLANT
SUT MNCRL AB 4-0 PS2 18 (SUTURE) ×2 IMPLANT
SYR CONTROL 10ML LL (SYRINGE) ×2 IMPLANT
TOWEL GREEN STERILE FF (TOWEL DISPOSABLE) ×4 IMPLANT
TUBE CONNECTING 12X1/4 (SUCTIONS) ×2 IMPLANT
TUBING ARTHROSCOPY IRRIG 16FT (MISCELLANEOUS) ×2 IMPLANT
WATER STERILE IRR 1000ML POUR (IV SOLUTION) ×2 IMPLANT
WRAP KNEE MAXI GEL POST OP (GAUZE/BANDAGES/DRESSINGS) ×2 IMPLANT

## 2021-07-02 NOTE — Anesthesia Preprocedure Evaluation (Addendum)
Anesthesia Evaluation  Patient identified by MRN, date of birth, ID band Patient awake    Reviewed: Allergy & Precautions, NPO status , Patient's Chart, lab work & pertinent test results  History of Anesthesia Complications Negative for: history of anesthetic complications  Airway Mallampati: I  TM Distance: >3 FB Neck ROM: Full    Dental  (+) Chipped, Dental Advisory Given   Pulmonary neg pulmonary ROS,    breath sounds clear to auscultation       Cardiovascular hypertension, Pt. on medications (-) angina Rhythm:Regular Rate:Normal     Neuro/Psych negative neurological ROS  negative psych ROS   GI/Hepatic Neg liver ROS, GERD  Controlled,  Endo/Other  Morbid obesity  Renal/GU negative Renal ROS  negative genitourinary   Musculoskeletal  (+) Arthritis ,   Abdominal (+) + obese,   Peds  Hematology negative hematology ROS (+)   Anesthesia Other Findings   Reproductive/Obstetrics                           Anesthesia Physical Anesthesia Plan  ASA: 2  Anesthesia Plan: General   Post-op Pain Management:    Induction: Intravenous  PONV Risk Score and Plan: Ondansetron, Dexamethasone and Midazolam  Airway Management Planned: LMA  Additional Equipment: None  Intra-op Plan:   Post-operative Plan: Extubation in OR  Informed Consent: I have reviewed the patients History and Physical, chart, labs and discussed the procedure including the risks, benefits and alternatives for the proposed anesthesia with the patient or authorized representative who has indicated his/her understanding and acceptance.     Dental advisory given  Plan Discussed with: CRNA and Surgeon  Anesthesia Plan Comments:        Anesthesia Quick Evaluation

## 2021-07-02 NOTE — Transfer of Care (Signed)
Immediate Anesthesia Transfer of Care Note  Patient: Stefanie Moon  Procedure(s) Performed: ARTHROSCOPY KNEE, LATERAL MENISECTOMY, CONDRAPLASTY, ASPIRATION OF BAKERS CYST (Left: Knee)  Patient Location: PACU  Anesthesia Type:General  Level of Consciousness: awake, alert  and oriented  Airway & Oxygen Therapy: Patient Spontanous Breathing and Patient connected to face mask oxygen  Post-op Assessment: Report given to RN and Post -op Vital signs reviewed and stable  Post vital signs: Reviewed and stable  Last Vitals:  Vitals Value Taken Time  BP 179/99 07/02/21 1851  Temp    Pulse 95 07/02/21 1851  Resp 27 07/02/21 1851  SpO2 99 % 07/02/21 1851  Vitals shown include unvalidated device data.  Last Pain:  Vitals:   07/02/21 1527  TempSrc:   PainSc: 9       Patients Stated Pain Goal: 2 (11/73/56 7014)  Complications: No notable events documented.

## 2021-07-02 NOTE — Anesthesia Procedure Notes (Signed)
Procedure Name: LMA Insertion Date/Time: 07/02/2021 3:38 PM Performed by: Fulton Reek, CRNA Pre-anesthesia Checklist: Patient identified, Emergency Drugs available, Suction available and Patient being monitored Patient Re-evaluated:Patient Re-evaluated prior to induction Oxygen Delivery Method: Circle System Utilized Preoxygenation: Pre-oxygenation with 100% oxygen Induction Type: IV induction LMA: LMA inserted LMA Size: 4.0 Number of attempts: 1 Placement Confirmation: positive ETCO2 Tube secured with: Tape Dental Injury: Teeth and Oropharynx as per pre-operative assessment

## 2021-07-02 NOTE — Interval H&P Note (Signed)
History and Physical Interval Note:  07/02/2021 5:22 PM  Stefanie Moon  has presented today for surgery, with the diagnosis of left knee medial and lateral mensicus tear.  The various methods of treatment have been discussed with the patient and family. After consideration of risks, benefits and other options for treatment, the patient has consented to  Procedure(s): ARTHROSCOPY KNEE (Left) as a surgical intervention.  The patient's history has been reviewed, patient examined, no change in status, stable for surgery.  I have reviewed the patient's chart and labs.  Questions were answered to the patient's satisfaction.     Augustin Schooling

## 2021-07-02 NOTE — Brief Op Note (Signed)
07/02/2021  6:49 PM  PATIENT:  Stefanie Moon  50 y.o. female  PRE-OPERATIVE DIAGNOSIS:  left knee medial and lateral mensicus tear  POST-OPERATIVE DIAGNOSIS:  left knee lateral mensicus tear, chondromalacia, Baker's Cyst  PROCEDURE:  Procedure(s): ARTHROSCOPY KNEE, LATERAL MENISECTOMY, CONDRAPLASTY, ASPIRATION OF BAKERS CYST (Left)  SURGEON:  Surgeon(s) and Role:    Netta Cedars, MD - Primary  PHYSICIAN ASSISTANT:   ASSISTANTS: none   ANESTHESIA:   regional and general  EBL:  minimal   BLOOD ADMINISTERED:none  DRAINS: none   LOCAL MEDICATIONS USED:  MARCAINE     SPECIMEN:  No Specimen  DISPOSITION OF SPECIMEN:  N/A  COUNTS:  YES  TOURNIQUET:  * No tourniquets in log *  DICTATION: .Other Dictation: Dictation Number [id number]  PLAN OF CARE: Discharge to home after PACU  PATIENT DISPOSITION:  PACU - hemodynamically stable.   Delay start of Pharmacological VTE agent (>24hrs) due to surgical blood loss or risk of bleeding: no

## 2021-07-02 NOTE — Anesthesia Postprocedure Evaluation (Signed)
Anesthesia Post Note  Patient: Stefanie Moon  Procedure(s) Performed: ARTHROSCOPY KNEE, LATERAL MENISECTOMY, CONDRAPLASTY, ASPIRATION OF BAKERS CYST (Left: Knee)     Patient location during evaluation: PACU Anesthesia Type: General Level of consciousness: awake and alert Pain management: pain level controlled Vital Signs Assessment: post-procedure vital signs reviewed and stable Respiratory status: spontaneous breathing, nonlabored ventilation, respiratory function stable and patient connected to nasal cannula oxygen Cardiovascular status: blood pressure returned to baseline and stable Postop Assessment: no apparent nausea or vomiting Anesthetic complications: no   No notable events documented.  Last Vitals:  Vitals:   07/02/21 2006 07/02/21 2021  BP: (!) 176/100 (!) 165/93  Pulse: 94 99  Resp: 13 20  Temp:  (!) 36.3 C  SpO2: 97% 96%    Last Pain:  Vitals:   07/02/21 2021  TempSrc:   PainSc: 2                  Halana Deisher

## 2021-07-02 NOTE — Discharge Instructions (Addendum)
Ice to the knee constantly.  Keep the incisions covered and clean and dry for 5 days (Monday next week), then ok to get your knee wet in the shower.  Do exercise as instructed every hour, please to prevent stiffness.    Heel slides Slide your heel on the floor or your bed or couch bending your knee and straightening your leg as you go, straight leg raises, tighten your thigh and lift your leg  and ankle pumps - move your foot up and down with your ankle to increase circulation to the foot and leg  DO NOT prop anything under the knee, it will make your knee stiff.  It is ok to Prop under the ankle to encourage your knee to go straight.   Use the walker or crutches to keep weight off your leg while you are up and also for balance.  The second week you can gradually increase weight on the leg. Wear your support stockings 24/7 to prevent blood clots and take baby aspirin twice daily for 30 days also to prevent blood clots  Follow up with Dr Veverly Fells in two weeks in the office, call [phone] for appt

## 2021-07-02 NOTE — Progress Notes (Signed)
Orthopedic Tech Progress Note Patient Details:  Stefanie Moon 03/08/1971 [id number]  Ortho Devices Type of Ortho Device: Crutches Ortho Device/Splint Interventions: Ordered   Post Interventions Patient Tolerated: Other (comment) Instructions Provided: Adjustment of device, Care of device, Poper ambulation with device  [name] 07/02/2021, 7:46 PM

## 2021-07-03 ENCOUNTER — Encounter (HOSPITAL_COMMUNITY): Payer: Self-pay | Admitting: Orthopedic Surgery

## 2021-07-03 NOTE — Op Note (Signed)
NAME: Stefanie Moon, Stefanie Moon MEDICAL RECORD NO: [id number] ACCOUNT NO: [id number] DATE OF BIRTH: July 14, 1971 FACILITY: [facility] LOCATION: [facility] PHYSICIAN: Doran Heater. Veverly Fells, MD  Operative Report   DATE OF PROCEDURE: 07/02/2021  PREOPERATIVE DIAGNOSIS:  Left knee medial and lateral meniscus tear.  POSTOPERATIVE DIAGNOSES:  1.  Left knee lateral meniscus tear. 2.  Chondromalacia. 3.  Baker's cyst.  PROCEDURE PERFORMED:  Left knee arthroscopy with partial lateral meniscectomy, chondroplasty times multiple compartments and aspiration of Baker's cyst.  ATTENDING SURGEON:  Esmond Plants, MD  ASSISTANT:  None.  ANESTHESIA:  General anesthesia plus local anesthesia was used.  ESTIMATED BLOOD LOSS:  Minimal.  Instrument count was correct.  There were no complications.  Perioperative antibiotics were given.  We also did 1500 mL crystalloid replacement.  INDICATIONS:  The patient is a 50 year old female with a history of worsening left knee pain and dysfunction secondary to a torn meniscus.  The patient has failed an extended period of conservative management, presents for operative treatment to remove  her torn meniscal fragment that is causing problems including pain and giving way in her knee and to restore function.  Informed consent obtained.  DESCRIPTION OF PROCEDURE:  After an adequate level of anesthesia was achieved, the patient was positioned in the supine position.  Left leg correctly identified, a padded post was utilized.  The knee was kept in extension.  We sterilely prepped and  draped the knee in the usual fashion.  We then performed our timeout, verifying correct patient, correct side.  We entered the knee using standard portals, including superolateral outflow, anterolateral scope and anteromedial working portals.  We entered  the knee and identified significant patellofemoral chondromalacia grade III, mostly on the trochlear side with some retropatellar wear grade III.  A gentle  tangential chondroplasty performed, entered the medial compartment. There was no appreciable  medial meniscus tear.  We probed the meniscus and visualized it very well anteriorly, medially; posteriorly it was difficult, but despite best efforts at a valgus maneuver, we could not see the meniscal root posteriorly, but I was able to use my meniscal  probe and feel on the undersurface of the meniscus, did not appreciate a tear.  The ACL was intact.  There was a displaced bucket handle lateral meniscus fragment in the notch.  We performed a debridement of that large bucket handle fragment that was  not repairable.  We did remove the majority of the lateral meniscus with this removal of large bucket handle fragment that appeared to really have looked like almost a discoid meniscus. With that removed,  the knee was able to fully extend with no block  to extension.  The chondral surfaces in the lateral compartment looked good.  The medial compartment had some grade III chondromalacia, loose flaps and fibrillation, debrided using suction shaver in that compartment and a couple little areas of exposed  bone underneath the medial meniscus on the medial side and also on the medial femoral condyle, also medial tibial condyle and femoral condyle more medially than central.  Once we completed our chondroplasty and a partial lateral meniscectomy, concluded  the surgery, suturing the wounds with nylon followed by injection with Marcaine, we did aspirate a Baker's cyst and obtained about 25 mL of normal cystic fluid from that Baker's cyst posteriorly, which I was able to palpate. A sterile bandage applied.   The patient was transported to the recovery room in stable condition.   SHW D: 07/02/2021 6:53:52 pm T: 07/03/2021 12:24:00 am  JOB: [id number]

## 2021-11-17 ENCOUNTER — Ambulatory Visit
Admission: RE | Admit: 2021-11-17 | Discharge: 2021-11-17 | Disposition: A | Discharge status: Home / Self Care | Payer: 59 | Source: Ambulatory Visit | Attending: Physician Assistant | Admitting: Physician Assistant

## 2021-11-17 ENCOUNTER — Other Ambulatory Visit: Payer: Self-pay

## 2021-11-17 VITALS — BP 182/106 | HR 75 | Temp 98.0°F | Resp 18

## 2021-11-17 DIAGNOSIS — K047 Periapical abscess without sinus: Secondary | ICD-10-CM | POA: Diagnosis not present

## 2021-11-17 MED ORDER — AMOXICILLIN-POT CLAVULANATE 875-125 MG PO TABS: 1.0000 | ORAL_TABLET | Freq: Two times a day (BID) | ORAL | 0 refills | Status: DC

## 2021-11-17 NOTE — ED Provider Notes (Signed)
[facility]    CSN: [id number] Arrival date & time: 11/17/21  1753      History   Chief Complaint Chief Complaint  Patient presents with   Dental Pain   appt 6    HPI Stefanie Moon is a 50 y.o. female.   Patient here today for evaluation of pain in her back right molar after a tooth broke a month ago. She has not had fever. She has tried tylenol for pain without significant improvement. She also reports longstanding runny nose. Has tried medications for same without improvement.   The history is provided by the patient.  Dental Pain Associated symptoms: no fever    Past Medical History:  Diagnosis Date   Anemia    Arthritis    knee   Blood transfusion without reported diagnosis    Fibroids    GERD (gastroesophageal reflux disease)    Hypertension     Patient Active Problem List   Diagnosis Date Noted   Uterine leiomyoma 09/03/2020   S/P abdominal hysterectomy 09/03/2020    Past Surgical History:  Procedure Laterality Date   APPENDECTOMY     CYSTOSCOPY N/A 09/03/2020   Procedure: CYSTOSCOPY;  Surgeon: Cheri Fowler, MD;  Location: [facility];  Service: Gynecology;  Laterality: N/A;   HYSTERECTOMY ABDOMINAL WITH SALPINGECTOMY Bilateral 09/03/2020   Procedure: HYSTERECTOMY ABDOMINAL WITH SALPINGECTOMY;  Surgeon: Cheri Fowler, MD;  Location: [facility];  Service: Gynecology;  Laterality: Bilateral;  TAP Block   KNEE ARTHROSCOPY Left 07/02/2021   Procedure: ARTHROSCOPY KNEE, LATERAL MENISECTOMY, CONDRAPLASTY, ASPIRATION OF BAKERS CYST;  Surgeon: Netta Cedars, MD;  Location: [facility];  Service: Orthopedics;  Laterality: Left;    OB History   No obstetric history on file.      Home Medications    Prior to Admission medications   Medication Sig Start Date End Date Taking? Authorizing Provider  amoxicillin-clavulanate (AUGMENTIN) 875-125 MG tablet Take 1 tablet by mouth every 12 (twelve) hours. 11/17/21  Yes Francene Finders, PA-C  acidophilus (RISAQUAD)  CAPS capsule Take 1 capsule by mouth daily.    [provider]  amLODipine (NORVASC) 5 MG tablet Take 1 tablet (5 mg total) by mouth daily. 02/01/21   Wieters, Hallie C, PA-C  diclofenac Sodium (VOLTAREN) 1 % GEL Apply 2 g topically 2 (two) times daily as needed (pain).    [provider]  diphenhydrAMINE (BENADRYL) 25 MG tablet Take 25 mg by mouth at bedtime.    [provider]  fluticasone (FLONASE) 50 MCG/ACT nasal spray Place 1-2 sprays into both nostrils daily. 02/01/21   Wieters, Hallie C, PA-C  loratadine (CLARITIN) 10 MG tablet Take 10 mg by mouth daily.    [provider]  methocarbamol (ROBAXIN) 500 MG tablet Take 1 tablet (500 mg total) by mouth every 6 (six) hours as needed for muscle spasms. 07/02/21   Netta Cedars, MD  ondansetron (ZOFRAN) 4 MG tablet Take 1 tablet (4 mg total) by mouth every 8 (eight) hours as needed for nausea, vomiting or refractory nausea / vomiting. 07/02/21   Netta Cedars, MD  Esomeprazole Magnesium (NEXIUM PO) Take by mouth daily.  02/01/21  [provider]  lisinopril (ZESTRIL) 20 MG tablet Take 20 mg by mouth daily.  02/01/21  [provider]    Family History History reviewed. No pertinent family history.  Social History Social History   Tobacco Use   Smoking status: Never   Smokeless tobacco: Never  Vaping Use   Vaping Use:  Never used  Substance Use Topics   Alcohol use: Yes    Comment: social wine once a month   Drug use: No     Allergies   Tramadol   Review of Systems Review of Systems  Constitutional:  Negative for chills and fever.  HENT:  Positive for dental problem and rhinorrhea.   Eyes:  Negative for discharge and redness.  Respiratory:  Negative for shortness of breath.     Physical Exam Triage Vital Signs ED Triage Vitals  Enc Vitals Group     BP 11/17/21 1828 (!) 182/106     Pulse Rate 11/17/21 1828 75     Resp 11/17/21 1828 18     Temp 11/17/21 1828 98 F (36.7 C)      Temp Source 11/17/21 1828 Oral     SpO2 11/17/21 1828 96 %     Weight --      Height --      Head Circumference --      Peak Flow --      Pain Score 11/17/21 1830 10     Pain Loc --      Pain Edu? --      Excl. in [place]? --    No data found.  Updated Vital Signs BP (!) 182/106 (BP Location: Left Arm)   Pulse 75   Temp 98 F (36.7 C) (Oral)   Resp 18   LMP 08/29/2020   SpO2 96%    Physical Exam Vitals and nursing note reviewed.  Constitutional:      General: She is not in acute distress.    Appearance: Normal appearance. She is not ill-appearing.  HENT:     Head: Normocephalic and atraumatic.     Mouth/Throat:     Comments: Broken back molar to right with dental caries noted, significant lateral gingival swelling and erythema Eyes:     Conjunctiva/sclera: Conjunctivae normal.  Cardiovascular:     Rate and Rhythm: Normal rate.  Pulmonary:     Effort: Pulmonary effort is normal.  Neurological:     Mental Status: She is alert.  Psychiatric:        Mood and Affect: Mood normal.        Behavior: Behavior normal.     UC Treatments / Results  Labs (all labs ordered are listed, but only abnormal results are displayed) Labs Reviewed - No data to display  EKG   Radiology No results found.  Procedures Procedures (including critical care time)  Medications Ordered in UC Medications - No data to display  Initial Impression / Assessment and Plan / UC Course  I have reviewed the triage vital signs and the nursing notes.  Pertinent labs & imaging results that were available during my care of the patient were reviewed by me and considered in my medical decision making (see chart for details).   Augmentin prescribed to cover both dental abscess as well as sinusitis if present. Recommended flonase for runny nose. Encouraged follow up with dentist as planned or in office with any further concerns.   Final Clinical Impressions(s) / UC Diagnoses   Final diagnoses:   Dental abscess   Discharge Instructions   None    ED Prescriptions     Medication Sig Dispense Auth. Provider   amoxicillin-clavulanate (AUGMENTIN) 875-125 MG tablet Take 1 tablet by mouth every 12 (twelve) hours. 14 tablet Francene Finders, PA-C      PDMP not reviewed this encounter.   Francene Finders, PA-C  11/17/21 1907  

## 2021-11-17 NOTE — ED Triage Notes (Signed)
Pt c/o dental abscess to rt lower teeth that broke off a month ago. States unable to see dentist appt the first of the year.   Pt c/o runny nose and post nasal drip of over 4 months and has seen PCP and tried multiple medications with no relief.

## 2022-01-11 ENCOUNTER — Ambulatory Visit
Admission: RE | Admit: 2022-01-11 | Discharge: 2022-01-11 | Disposition: A | Discharge status: Home / Self Care | Payer: 59 | Source: Ambulatory Visit | Attending: Internal Medicine | Admitting: Internal Medicine

## 2022-01-11 ENCOUNTER — Other Ambulatory Visit: Payer: Self-pay

## 2022-01-11 VITALS — BP 179/120 | HR 90 | Temp 98.5°F | Resp 16

## 2022-01-11 DIAGNOSIS — I160 Hypertensive urgency: Secondary | ICD-10-CM

## 2022-01-11 MED ORDER — AMLODIPINE BESYLATE 5 MG PO TABS: 5.0000 mg | ORAL_TABLET | Freq: Every day | ORAL | 0 refills | Status: DC

## 2022-01-11 NOTE — ED Provider Notes (Signed)
[facility]    CSN: [id number] Arrival date & time: 01/11/22  0845      History   Chief Complaint Chief Complaint  Patient presents with   Hypertension    HPI Stefanie Moon is a 51 y.o. female.   Patient presents for concerns of elevated blood pressure.  She reports that she ran out of her blood pressure medication approximately 2 weeks ago and has has elevated pressure with associated blurred vision, headache, dizziness that started approximately 1 week ago.  Denies chest pain, shortness of breath, nausea, vomiting.  She reports that she went to her gynecologist 3 days ago and her blood pressure was 500 systolic and she was advised to go to an urgent care to have her blood pressure medication refilled.  She has been taking her blood pressure periodically at home since being out of her medication and has been ranging 938H to 829H systolic.  She last saw her PCP multiple years ago.  She had an appointment with primary care but it was canceled due to provider leaving the office.  She has been having difficulty scheduling another PCP appointment due to insurance problems.  She possibly has another PCP appointment scheduled in approximately 2 months if insurance goes through.   Hypertension   Past Medical History:  Diagnosis Date   Anemia    Arthritis    knee   Blood transfusion without reported diagnosis    Fibroids    GERD (gastroesophageal reflux disease)    Hypertension     Patient Active Problem List   Diagnosis Date Noted   Uterine leiomyoma 09/03/2020   S/P abdominal hysterectomy 09/03/2020    Past Surgical History:  Procedure Laterality Date   APPENDECTOMY     CYSTOSCOPY N/A 09/03/2020   Procedure: CYSTOSCOPY;  Surgeon: Cheri Annalise Mcdiarmid, MD;  Location: [place];  Service: Gynecology;  Laterality: N/A;   HYSTERECTOMY ABDOMINAL WITH SALPINGECTOMY Bilateral 09/03/2020   Procedure: HYSTERECTOMY ABDOMINAL WITH SALPINGECTOMY;  Surgeon: Cheri Wai Minotti, MD;   Location: [place];  Service: Gynecology;  Laterality: Bilateral;  TAP Block   KNEE ARTHROSCOPY Left 07/02/2021   Procedure: ARTHROSCOPY KNEE, LATERAL MENISECTOMY, CONDRAPLASTY, ASPIRATION OF BAKERS CYST;  Surgeon: Netta Cedars, MD;  Location: [place];  Service: Orthopedics;  Laterality: Left;    OB History   No obstetric history on file.      Home Medications    Prior to Admission medications   Medication Sig Start Date End Date Taking? Authorizing Provider  acidophilus (RISAQUAD) CAPS capsule Take 1 capsule by mouth daily.    [provider]  amLODipine (NORVASC) 5 MG tablet Take 1 tablet (5 mg total) by mouth daily. 01/11/22   Teodora Medici, FNP  amoxicillin-clavulanate (AUGMENTIN) 875-125 MG tablet Take 1 tablet by mouth every 12 (twelve) hours. 11/17/21   Francene Finders, PA-C  diclofenac Sodium (VOLTAREN) 1 % GEL Apply 2 g topically 2 (two) times daily as needed (pain).    [provider]  diphenhydrAMINE (BENADRYL) 25 MG tablet Take 25 mg by mouth at bedtime.    [provider]  fluticasone (FLONASE) 50 MCG/ACT nasal spray Place 1-2 sprays into both nostrils daily. 02/01/21   Wieters, Hallie C, PA-C  loratadine (CLARITIN) 10 MG tablet Take 10 mg by mouth daily.    [provider]  methocarbamol (ROBAXIN) 500 MG tablet Take 1 tablet (500 mg total) by mouth every 6 (six) hours as needed for muscle spasms. 07/02/21   Netta Cedars, MD  ondansetron (ZOFRAN) 4 MG tablet Take 1 tablet (4 mg total) by mouth every 8 (eight) hours as needed for nausea, vomiting or refractory nausea / vomiting. 07/02/21   Netta Cedars, MD  Esomeprazole Magnesium (NEXIUM PO) Take by mouth daily.  02/01/21  [provider]  lisinopril (ZESTRIL) 20 MG tablet Take 20 mg by mouth daily.  02/01/21  [provider]    Family History History reviewed. No pertinent family history.  Social History Social History   Tobacco Use   Smoking status: Never   Smokeless tobacco:  Never  Vaping Use   Vaping Use: Never used  Substance Use Topics   Alcohol use: Yes    Comment: social wine once a month   Drug use: No     Allergies   Tramadol   Review of Systems Review of Systems Per HPI  Physical Exam Triage Vital Signs ED Triage Vitals  Enc Vitals Group     BP 01/11/22 0858 (!) 179/120     Pulse Rate 01/11/22 0858 90     Resp 01/11/22 0858 16     Temp 01/11/22 0858 98.5 F (36.9 C)     Temp Source 01/11/22 0858 Oral     SpO2 01/11/22 0858 97 %     Weight --      Height --      Head Circumference --      Peak Flow --      Pain Score 01/11/22 0903 0     Pain Loc --      Pain Edu? --      Excl. in [place]? --    No data found.  Updated Vital Signs BP (!) 179/120 (BP Location: Right Arm)    Pulse 90    Temp 98.5 F (36.9 C) (Oral)    Resp 16    LMP 08/29/2020    SpO2 97%   Visual Acuity Right Eye Distance:   Left Eye Distance:   Bilateral Distance:    Right Eye Near:   Left Eye Near:    Bilateral Near:     Physical Exam Constitutional:      General: She is not in acute distress.    Appearance: Normal appearance. She is not toxic-appearing or diaphoretic.  HENT:     Head: Normocephalic and atraumatic.  Eyes:     Extraocular Movements: Extraocular movements intact.     Conjunctiva/sclera: Conjunctivae normal.     Pupils: Pupils are equal, round, and reactive to light.  Cardiovascular:     Rate and Rhythm: Normal rate and regular rhythm.     Pulses: Normal pulses.     Heart sounds: Normal heart sounds.  Pulmonary:     Effort: Pulmonary effort is normal. No respiratory distress.     Breath sounds: Normal breath sounds.  Neurological:     General: No focal deficit present.     Mental Status: She is alert and oriented to person, place, and time. Mental status is at baseline.     Cranial Nerves: Cranial nerves 2-12 are intact.     Sensory: Sensation is intact.     Motor: Motor function is intact.     Coordination: Coordination is  intact.     Gait: Gait is intact.  Psychiatric:        Mood and Affect: Mood normal.        Behavior: Behavior normal.        Thought Content: Thought content normal.  Judgment: Judgment normal.     UC Treatments / Results  Labs (all labs ordered are listed, but only abnormal results are displayed) Labs Reviewed - No data to display  EKG   Radiology No results found.  Procedures Procedures (including critical care time)  Medications Ordered in UC Medications - No data to display  Initial Impression / Assessment and Plan / UC Course  I have reviewed the triage vital signs and the nursing notes.  Pertinent labs & imaging results that were available during my care of the patient were reviewed by me and considered in my medical decision making (see chart for details).     Patient was advised to go to the hospital for further evaluation and management given signs of hypertensive urgency that include elevated blood pressure reading with associated dizziness, headache, blurred vision.  Patient refused to go to the hospital.  Risks associated with not going to the hospital were discussed with patient.  Since patient declined going to the hospital, I will refill patient's blood pressure medication.  She is not sure the name of her blood pressure medication but amlodipine is the last known blood pressure medication filled.  Will refill amlodipine.  Patient to follow-up with PCP for further evaluation and management.  Advised patient to monitor blood pressure very closely at home and to go the hospital if it remains elevated despite refilling blood pressure medication.  Patient verbalized understanding and was agreeable with plan. Final Clinical Impressions(s) / UC Diagnoses   Final diagnoses:  Hypertensive urgency     Discharge Instructions      Your blood pressure medication has been refilled.  Please monitor very closely and go to the hospital if it remains elevated.   Follow-up with primary care for further evaluation and management.    ED Prescriptions     Medication Sig Dispense Auth. Provider   amLODipine (NORVASC) 5 MG tablet Take 1 tablet (5 mg total) by mouth daily. 60 tablet [place], Michele Rockers, [place]      PDMP not reviewed this encounter.   Teodora Medici, [place] 01/11/22 1002

## 2022-01-11 NOTE — ED Triage Notes (Signed)
Had difficulty getting in to be seen by PCP. Ran out of BP meds 2 weeks ago. BP has been running high. States headache, blurred vision, and dizziness started 1 week prior. States she's been seeing stars. BP in triage is 179/120

## 2022-01-11 NOTE — Discharge Instructions (Addendum)
Your blood pressure medication has been refilled.  Please monitor very closely and go to the hospital if it remains elevated.  Follow-up with primary care for further evaluation and management.

## 2022-03-26 ENCOUNTER — Ambulatory Visit
Admission: RE | Admit: 2022-03-26 | Discharge: 2022-03-26 | Disposition: A | Discharge status: Home / Self Care | Payer: 59 | Source: Ambulatory Visit | Attending: Internal Medicine | Admitting: Internal Medicine

## 2022-03-26 VITALS — BP 155/90 | HR 75 | Temp 98.3°F | Resp 18 | Ht 64.0 in | Wt 205.9 lb

## 2022-03-26 DIAGNOSIS — Z760 Encounter for issue of repeat prescription: Secondary | ICD-10-CM

## 2022-03-26 DIAGNOSIS — I10 Essential (primary) hypertension: Secondary | ICD-10-CM

## 2022-03-26 MED ORDER — AMLODIPINE BESYLATE 5 MG PO TABS: 5.0000 mg | ORAL_TABLET | Freq: Every day | ORAL | 0 refills | Status: DC

## 2022-03-26 NOTE — ED Triage Notes (Signed)
Patient is requesting a refill on her BP meds, has an appt with PCP in June.  Needing a refill on Norvasc. ?

## 2022-03-26 NOTE — Discharge Instructions (Signed)
Your blood pressure medication has been refilled.  Please follow-up if needed and follow-up with primary care doctor as well. ?

## 2022-03-26 NOTE — ED Provider Notes (Signed)
?[facility] ? ? ? ?CSN: [id number] ?Arrival date & time: 03/26/22  1002 ? ? ?  ? ?History   ?Chief Complaint ?Chief Complaint  ?Patient presents with  ? Blood Sugar Problem  ?  Entered by patient  ? Appointment  ?  Medication Refill  ? ? ?HPI ?Stefanie Moon is a 51 y.o. female.  ? ?Patient presents requesting medication refill for amlodipine.  Patient restarted on amlodipine in January 2023.  Patient reports that she has been taking her blood pressure at home after starting the medication and systolic blood pressure has been in the 130s.  Denies any associated chest pain, shortness of breath, headache, dizziness, nausea, vomiting, blurred vision.  Patient has been tolerating the medication well.  She has been having issues with her insurance and reports that she had a scheduled appointment but was not able to be seen because the healthcare provider did not take her insurance.  She has another appointment scheduled in June with PCP for further evaluation and management.  She has been out of her blood pressure medication for approximately 1 day. ? ? ? ?Past Medical History:  ?Diagnosis Date  ? Anemia   ? Arthritis   ? knee  ? Blood transfusion without reported diagnosis   ? Fibroids   ? GERD (gastroesophageal reflux disease)   ? Hypertension   ? ? ?Patient Active Problem List  ? Diagnosis Date Noted  ? Uterine leiomyoma 09/03/2020  ? S/P abdominal hysterectomy 09/03/2020  ? ? ?Past Surgical History:  ?Procedure Laterality Date  ? APPENDECTOMY    ? CYSTOSCOPY N/A 09/03/2020  ? Procedure: CYSTOSCOPY;  Surgeon: Cheri Fowler, MD;  Location: [place];  Service: Gynecology;  Laterality: N/A;  ? HYSTERECTOMY ABDOMINAL WITH SALPINGECTOMY Bilateral 09/03/2020  ? Procedure: HYSTERECTOMY ABDOMINAL WITH SALPINGECTOMY;  Surgeon: Cheri Fowler, MD;  Location: [place];  Service: Gynecology;  Laterality: Bilateral;  TAP Block  ? KNEE ARTHROSCOPY Left 07/02/2021  ? Procedure: ARTHROSCOPY KNEE, LATERAL MENISECTOMY,  CONDRAPLASTY, ASPIRATION OF BAKERS CYST;  Surgeon: Netta Cedars, MD;  Location: [place];  Service: Orthopedics;  Laterality: Left;  ? ? ?OB History   ?No obstetric history on file. ?  ? ? ? ?Home Medications   ? ?Prior to Admission medications   ?Medication Sig Start Date End Date Taking? Authorizing Provider  ?acidophilus (RISAQUAD) CAPS capsule Take 1 capsule by mouth daily.   Yes [provider]  ?amoxicillin-clavulanate (AUGMENTIN) 875-125 MG tablet Take 1 tablet by mouth every 12 (twelve) hours. 11/17/21  Yes Francene Finders, PA-C  ?diclofenac Sodium (VOLTAREN) 1 % GEL Apply 2 g topically 2 (two) times daily as needed (pain).   Yes [provider]  ?diphenhydrAMINE (BENADRYL) 25 MG tablet Take 25 mg by mouth at bedtime.   Yes [provider]  ?fluticasone (FLONASE) 50 MCG/ACT nasal spray Place 1-2 sprays into both nostrils daily. 02/01/21  Yes Wieters, Hallie C, PA-C  ?loratadine (CLARITIN) 10 MG tablet Take 10 mg by mouth daily.   Yes [provider]  ?methocarbamol (ROBAXIN) 500 MG tablet Take 1 tablet (500 mg total) by mouth every 6 (six) hours as needed for muscle spasms. 07/02/21  Yes Netta Cedars, MD  ?ondansetron (ZOFRAN) 4 MG tablet Take 1 tablet (4 mg total) by mouth every 8 (eight) hours as needed for nausea, vomiting or refractory nausea / vomiting. 07/02/21  Yes Netta Cedars, MD  ?amLODipine (NORVASC) 5 MG tablet Take 1 tablet (5 mg total) by mouth daily. 03/26/22  Teodora Medici, [place]  ?Esomeprazole Magnesium (NEXIUM PO) Take by mouth daily.  02/01/21  [provider]  ?lisinopril (ZESTRIL) 20 MG tablet Take 20 mg by mouth daily.  02/01/21  [provider]  ? ? ?Family History ?History reviewed. No pertinent family history. ? ?Social History ?Social History  ? ?Tobacco Use  ? Smoking status: Never  ? Smokeless tobacco: Never  ?Vaping Use  ? Vaping Use: Never used  ?Substance Use Topics  ? Alcohol use: Yes  ?  Comment: social wine once a month  ? Drug  use: No  ? ? ? ?Allergies   ?Tramadol ? ? ?Review of Systems ?Review of Systems ?Per HPI ? ?Physical Exam ?Triage Vital Signs ?ED Triage Vitals  ?Enc Vitals Group  ?   BP 03/26/22 1008 (!) 155/90  ?   Pulse Rate 03/26/22 1008 75  ?   Resp 03/26/22 1008 18  ?   Temp 03/26/22 1008 98.3 ?F (36.8 ?C)  ?   Temp Source 03/26/22 1008 Oral  ?   SpO2 03/26/22 1008 96 %  ?   Weight 03/26/22 1010 205 lb 14.6 oz (93.4 kg)  ?   Height 03/26/22 1010 '5\' 4"'$  (1.626 m)  ?   Head Circumference --   ?   Peak Flow --   ?   Pain Score 03/26/22 1009 0  ?   Pain Loc --   ?   Pain Edu? --   ?   Excl. in [place]? --   ? ?No data found. ? ?Updated Vital Signs ?BP (!) 155/90 (BP Location: Right Arm)   Pulse 75   Temp 98.3 ?F (36.8 ?C) (Oral)   Resp 18   Ht '5\' 4"'$  (1.626 m)   Wt 205 lb 14.6 oz (93.4 kg)   LMP 08/29/2020   SpO2 96%   BMI 35.34 kg/m?  ? ?Visual Acuity ?Right Eye Distance:   ?Left Eye Distance:   ?Bilateral Distance:   ? ?Right Eye Near:   ?Left Eye Near:    ?Bilateral Near:    ? ?Physical Exam ?Constitutional:   ?   General: She is not in acute distress. ?   Appearance: Normal appearance. She is not toxic-appearing or diaphoretic.  ?HENT:  ?   Head: Normocephalic and atraumatic.  ?Eyes:  ?   Extraocular Movements: Extraocular movements intact.  ?   Conjunctiva/sclera: Conjunctivae normal.  ?   Pupils: Pupils are equal, round, and reactive to light.  ?Cardiovascular:  ?   Rate and Rhythm: Normal rate and regular rhythm.  ?   Pulses: Normal pulses.  ?   Heart sounds: Normal heart sounds.  ?Pulmonary:  ?   Effort: Pulmonary effort is normal. No respiratory distress.  ?   Breath sounds: Normal breath sounds.  ?Neurological:  ?   General: No focal deficit present.  ?   Mental Status: She is alert and oriented to person, place, and time. Mental status is at baseline.  ?   Cranial Nerves: Cranial nerves 2-12 are intact.  ?   Sensory: Sensation is intact.  ?   Motor: Motor function is intact.  ?   Coordination: Coordination is intact.   ?   Gait: Gait is intact.  ?Psychiatric:     ?   Mood and Affect: Mood normal.     ?   Behavior: Behavior normal.     ?   Thought Content: Thought content normal.     ?   Judgment: Judgment  normal.  ? ? ? ?UC Treatments / Results  ?Labs ?(all labs ordered are listed, but only abnormal results are displayed) ?Labs Reviewed - No data to display ? ?EKG ? ? ?Radiology ?No results found. ? ?Procedures ?Procedures (including critical care time) ? ?Medications Ordered in UC ?Medications - No data to display ? ?Initial Impression / Assessment and Plan / UC Course  ?I have reviewed the triage vital signs and the nursing notes. ? ?Pertinent labs & imaging results that were available during my care of the patient were reviewed by me and considered in my medical decision making (see chart for details). ? ?  ? ?Patient appears to be tolerating medication well and needs amlodipine refilled.  Do think it is reasonable for 30-day supply of amlodipine as I am not patient's PCP.  Advised to try to get appointment sooner with the PCP for further evaluation and management.  Patient advised that she can follow-up in urgent care for further refills if necessary.  Patient to continue monitoring blood pressure at home.  No signs of hypertensive urgency or endorgan damage on exam at this time.  Discussed return precautions.  Patient verbalized understanding and was agreeable with plan. ?Final Clinical Impressions(s) / UC Diagnoses  ? ?Final diagnoses:  ?Essential hypertension  ?Medication refill  ? ? ? ?Discharge Instructions   ? ?  ?Your blood pressure medication has been refilled.  Please follow-up if needed and follow-up with primary care doctor as well. ? ? ? ?ED Prescriptions   ? ? Medication Sig Dispense Auth. Provider  ? amLODipine (NORVASC) 5 MG tablet Take 1 tablet (5 mg total) by mouth daily. 30 tablet Oswaldo Conroy E, [place]  ? ?  ? ?PDMP not reviewed this encounter. ?  ?Teodora Medici, [place] ?03/26/22 1038 ? ?

## 2022-03-31 ENCOUNTER — Encounter (INDEPENDENT_AMBULATORY_CARE_PROVIDER_SITE_OTHER): Payer: Self-pay | Admitting: Primary Care

## 2022-03-31 ENCOUNTER — Ambulatory Visit (INDEPENDENT_AMBULATORY_CARE_PROVIDER_SITE_OTHER): Payer: 59 | Admitting: Primary Care

## 2022-03-31 VITALS — BP 149/92 | HR 86 | Temp 98.1°F | Ht 64.0 in | Wt 223.0 lb

## 2022-03-31 DIAGNOSIS — Z131 Encounter for screening for diabetes mellitus: Secondary | ICD-10-CM

## 2022-03-31 DIAGNOSIS — K2100 Gastro-esophageal reflux disease with esophagitis, without bleeding: Secondary | ICD-10-CM

## 2022-03-31 DIAGNOSIS — I10 Essential (primary) hypertension: Secondary | ICD-10-CM | POA: Diagnosis not present

## 2022-03-31 DIAGNOSIS — D219 Benign neoplasm of connective and other soft tissue, unspecified: Secondary | ICD-10-CM

## 2022-03-31 DIAGNOSIS — Z1211 Encounter for screening for malignant neoplasm of colon: Secondary | ICD-10-CM

## 2022-03-31 LAB — POCT GLYCOSYLATED HEMOGLOBIN (HGB A1C): Hemoglobin A1C: 5.4 % (ref 4.0–5.6)

## 2022-03-31 MED ORDER — AMLODIPINE BESYLATE 10 MG PO TABS: 10.0000 mg | ORAL_TABLET | Freq: Every day | ORAL | 1 refills | Status: AC

## 2022-03-31 MED ORDER — HYDROCHLOROTHIAZIDE 25 MG PO TABS: 25.0000 mg | ORAL_TABLET | Freq: Every day | ORAL | 3 refills | Status: DC

## 2022-03-31 MED ORDER — PANTOPRAZOLE SODIUM 40 MG PO TBEC: 40.0000 mg | DELAYED_RELEASE_TABLET | Freq: Every day | ORAL | 3 refills | Status: AC

## 2022-03-31 NOTE — Progress Notes (Signed)
? ?New Patient Office Visit ? ?Subjective:  ?Patient ID: Stefanie Moon, female    DOB: 12-13-1971  Age: 51 y.o. MRN: [id number] ? ?CC:  ?Chief Complaint  ?Patient presents with  ? New Patient (Initial Visit)  ? ? ?HPI ?Stefanie Moon is a 51 year old obese female  presents for establishment of care and uncontrolled hypertension. Currently on amlodipine '5mg'$  daily. Denies shortness of breath, chest pain or lower extremity edema. She admits to headache at least twice a week. She also, has been having increased indigestion especially after eating. Has a hx of bilateral knee pain dx with arthritis knee. She had orthoscopy LFT knee but was told needed TKR.   ? ?Past Medical History:  ?Diagnosis Date  ? Anemia   ? Arthritis   ? knee  ? Blood transfusion without reported diagnosis   ? Fibroids   ? GERD (gastroesophageal reflux disease)   ? Hypertension   ? ? ?Past Surgical History:  ?Procedure Laterality Date  ? APPENDECTOMY    ? CYSTOSCOPY N/A 09/03/2020  ? Procedure: CYSTOSCOPY;  Surgeon: Cheri Fowler, MD;  Location: [facility];  Service: Gynecology;  Laterality: N/A;  ? HYSTERECTOMY ABDOMINAL WITH SALPINGECTOMY Bilateral 09/03/2020  ? Procedure: HYSTERECTOMY ABDOMINAL WITH SALPINGECTOMY;  Surgeon: Cheri Fowler, MD;  Location: [facility];  Service: Gynecology;  Laterality: Bilateral;  TAP Block  ? KNEE ARTHROSCOPY Left 07/02/2021  ? Procedure: ARTHROSCOPY KNEE, LATERAL MENISECTOMY, CONDRAPLASTY, ASPIRATION OF BAKERS CYST;  Surgeon: Netta Cedars, MD;  Location: [facility];  Service: Orthopedics;  Laterality: Left;  ? ? ?No family history on file. ? ?Social History  ? ?Socioeconomic History  ? Marital status: Single  ?  Spouse name: Not on file  ? Number of children: Not on file  ? Years of education: Not on file  ? Highest education level: Not on file  ?Occupational History  ? Not on file  ?Tobacco Use  ? Smoking status: Never  ? Smokeless tobacco: Never  ?Vaping Use  ? Vaping Use: Never used  ?Substance and Sexual Activity   ? Alcohol use: Yes  ?  Comment: social wine once a month  ? Drug use: No  ? Sexual activity: Yes  ?  Birth control/protection: Pill  ?Other Topics Concern  ? Not on file  ?Social History Narrative  ? Not on file  ? ?Social Determinants of Health  ? ?Financial Resource Strain: Not on file  ?Food Insecurity: Not on file  ?Transportation Needs: Not on file  ?Physical Activity: Not on file  ?Stress: Not on file  ?Social Connections: Not on file  ?Intimate Partner Violence: Not on file  ? ? ?ROS ?Comprehensive ROS Pertinent positive and negative noted in HPI   ? ?Objective:  ? ?Today's Vitals: BP (!) 149/92 (BP Location: Right Arm, Patient Position: Sitting, Cuff Size: Large)   Pulse 86   Temp 98.1 ?F (36.7 ?C) (Oral)   Ht '5\' 4"'$  (1.626 m)   Wt 223 lb (101.2 kg)   LMP 08/29/2020   SpO2 94%   BMI 38.28 kg/m?  ? ?Physical Exam ?Vitals reviewed.  ?Constitutional:   ?   Appearance: She is obese.  ?HENT:  ?   Head: Normocephalic.  ?   Right Ear: Tympanic membrane and external ear normal.  ?   Left Ear: Tympanic membrane and external ear normal.  ?   Nose: Nose normal.  ?Eyes:  ?   Extraocular Movements: Extraocular movements intact.  ?   Conjunctiva/sclera: Conjunctivae normal.  ?  Pupils: Pupils are equal, round, and reactive to light.  ?Cardiovascular:  ?   Rate and Rhythm: Normal rate and regular rhythm.  ?Pulmonary:  ?   Effort: Pulmonary effort is normal.  ?   Breath sounds: Normal breath sounds.  ?Abdominal:  ?   General: Bowel sounds are normal. There is distension.  ?   Palpations: Abdomen is soft.  ?Musculoskeletal:     ?   General: Normal range of motion.  ?   Cervical back: Normal range of motion and neck supple.  ?Skin: ?   General: Skin is warm and dry.  ?Neurological:  ?   Mental Status: She is alert and oriented to person, place, and time.  ?Psychiatric:     ?   Mood and Affect: Mood normal.     ?   Behavior: Behavior normal.     ?   Thought Content: Thought content normal.     ?   Judgment: Judgment  normal.  ? ?Assessment & Plan:  ?Stefanie was seen today for new patient (initial visit). ? ?Diagnoses and all orders for this visit: ? ?Screening for diabetes mellitus ?-     HgB A1c ? ?Essential hypertension ?BP goal - < 130/80 ?Explained that having normal blood pressure is the goal and medications are helping to get to goal and maintain normal blood pressure. ?DIET: Limit salt intake, read nutrition labels to check salt content, limit fried and high fatty foods  ?Avoid using multisymptom OTC cold preparations that generally contain sudafed which can rise BP. Consult with pharmacist on best cold relief products to use for persons with HTN ?EXERCISE ?Discussed incorporating exercise such as walking - 30 minutes most days of the week and can do in 10 minute intervals    ?-     hydrochlorothiazide (HYDRODIURIL) 25 MG tablet; Take 1 tablet (25 mg total) by mouth daily. ?-     amLODipine (NORVASC) 10 MG tablet; Take 1 tablet (10 mg total) by mouth daily. ? ?Gastroesophageal reflux disease with esophagitis without hemorrhage ?Discussed eating small frequent meal, reduction in acidic foods, fried foods ,spicy foods, alcohol caffeine and tobacco and certain medications. Avoid laying down after eating 41mns-1hour, elevated head of the bed.  ?Protonix '40mg'$  daily as needed ? ?Special screening for malignant neoplasms, colon ?-     Ambulatory referral to Gastroenterology ? ?Class 2 severe obesity due to excess calories with serious comorbidity in adult, unspecified BMI ([place] ?Obesity is 30-39 indicating an excess in caloric intake or underlining conditions. This may lead to other co-morbidities. Lifestyle modifications of diet and exercise may reduce obesity.   ? ?Fibroids ?-     Ambulatory referral to Gynecology ? ?  ? ? ?Outpatient Encounter Medications as of 03/31/2022  ?Medication Sig  ? amLODipine (NORVASC) 10 MG tablet Take 1 tablet (10 mg total) by mouth daily.  ? diclofenac Sodium (VOLTAREN) 1 % GEL Apply 2 g  topically 2 (two) times daily as needed (pain).  ? hydrochlorothiazide (HYDRODIURIL) 25 MG tablet Take 1 tablet (25 mg total) by mouth daily.  ? methocarbamol (ROBAXIN) 500 MG tablet Take 1 tablet (500 mg total) by mouth every 6 (six) hours as needed for muscle spasms.  ? [DISCONTINUED] amLODipine (NORVASC) 5 MG tablet Take 1 tablet (5 mg total) by mouth daily.  ? acidophilus (RISAQUAD) CAPS capsule Take 1 capsule by mouth daily.  ? [DISCONTINUED] amoxicillin-clavulanate (AUGMENTIN) 875-125 MG tablet Take 1 tablet by mouth every 12 (twelve) hours.  ? [DISCONTINUED] diphenhydrAMINE (  BENADRYL) 25 MG tablet Take 25 mg by mouth at bedtime.  ? [DISCONTINUED] Esomeprazole Magnesium (NEXIUM PO) Take by mouth daily.  ? [DISCONTINUED] fluticasone (FLONASE) 50 MCG/ACT nasal spray Place 1-2 sprays into both nostrils daily.  ? [DISCONTINUED] lisinopril (ZESTRIL) 20 MG tablet Take 20 mg by mouth daily.  ? [DISCONTINUED] loratadine (CLARITIN) 10 MG tablet Take 10 mg by mouth daily.  ? [DISCONTINUED] ondansetron (ZOFRAN) 4 MG tablet Take 1 tablet (4 mg total) by mouth every 8 (eight) hours as needed for nausea, vomiting or refractory nausea / vomiting.  ? ?No facility-administered encounter medications on file as of 03/31/2022.  ? ? ?Follow-up: Return in about 2 weeks (around 04/14/2022) for Bp check .  ? ?Kerin Perna, NP ? ?

## 2022-03-31 NOTE — Patient Instructions (Addendum)
Calorie Counting for Weight Loss ?Calories are units of energy. Your body needs a certain number of calories from food to keep going throughout the day. When you eat or drink more calories than your body needs, your body stores the extra calories mostly as fat. When you eat or drink fewer calories than your body needs, your body burns fat to get the energy it needs. ?Calorie counting means keeping track of how many calories you eat and drink each day. Calorie counting can be helpful if you need to lose weight. If you eat fewer calories than your body needs, you should lose weight. Ask your health care provider what a healthy weight is for you. ?For calorie counting to work, you will need to eat the right number of calories each day to lose a healthy amount of weight per week. A dietitian can help you figure out how many calories you need in a day and will suggest ways to reach your calorie goal. ?A healthy amount of weight to lose each week is usually 1-2 lb (0.5-0.9 kg). This usually means that your daily calorie intake should be reduced by 500-750 calories. ?Eating 1,200-1,500 calories a day can help most women lose weight. ?Eating 1,500-1,800 calories a day can help most men lose weight. ?What do I need to know about calorie counting? ?Work with your health care provider or dietitian to determine how many calories you should get each day. To meet your daily calorie goal, you will need to: ?Find out how many calories are in each food that you would like to eat. Try to do this before you eat. ?Decide how much of the food you plan to eat. ?Keep a food log. Do this by writing down what you ate and how many calories it had. ?To successfully lose weight, it is important to balance calorie counting with a healthy lifestyle that includes regular activity. ?Where do I find calorie information? ?The number of calories in a food can be found on a Nutrition Facts label. If a food does not have a Nutrition Facts label, try to  look up the calories online or ask your dietitian for help. ?Remember that calories are listed per serving. If you choose to have more than one serving of a food, you will have to multiply the calories per serving by the number of servings you plan to eat. For example, the label on a package of bread might say that a serving size is 1 slice and that there are 90 calories in a serving. If you eat 1 slice, you will have eaten 90 calories. If you eat 2 slices, you will have eaten 180 calories. ?How do I keep a food log? ?After each time that you eat, record the following in your food log as soon as possible: ?What you ate. Be sure to include toppings, sauces, and other extras on the food. ?How much you ate. This can be measured in cups, ounces, or number of items. ?How many calories were in each food and drink. ?The total number of calories in the food you ate. ?Keep your food log near you, such as in a pocket-sized notebook or on an app or website on your mobile phone. Some programs will calculate calories for you and show you how many calories you have left to meet your daily goal. ?What are some portion-control tips? ?Know how many calories are in a serving. This will help you know how many servings you can have of a certain food. ?  Use a measuring cup to measure serving sizes. You could also try weighing out portions on a kitchen scale. With time, you will be able to estimate serving sizes for some foods. ?Take time to put servings of different foods on your favorite plates or in your favorite bowls and cups so you know what a serving looks like. ?Try not to eat straight from a food's packaging, such as from a bag or box. Eating straight from the package makes it hard to see how much you are eating and can lead to overeating. Put the amount you would like to eat in a cup or on a plate to make sure you are eating the right portion. ?Use smaller plates, glasses, and bowls for smaller portions and to prevent  overeating. ?Try not to multitask. For example, avoid watching TV or using your computer while eating. If it is time to eat, sit down at a table and enjoy your food. This will help you recognize when you are full. It will also help you be more mindful of what and how much you are eating. ?What are tips for following this plan? ?Reading food labels ?Check the calorie count compared with the serving size. The serving size may be smaller than what you are used to eating. ?Check the source of the calories. Try to choose foods that are high in protein, fiber, and vitamins, and low in saturated fat, trans fat, and sodium. ?Shopping ?Read nutrition labels while you shop. This will help you make healthy decisions about which foods to buy. ?Pay attention to nutrition labels for low-fat or fat-free foods. These foods sometimes have the same number of calories or more calories than the full-fat versions. They also often have added sugar, starch, or salt to make up for flavor that was removed with the fat. ?Make a grocery list of lower-calorie foods and stick to it. ?Cooking ?Try to cook your favorite foods in a healthier way. For example, try baking instead of frying. ?Use low-fat dairy products. ?Meal planning ?Use more fruits and vegetables. One-half of your plate should be fruits and vegetables. ?Include lean proteins, such as chicken, [place], and fish. ?Lifestyle ?Each week, aim to do one of the following: ?150 minutes of moderate exercise, such as walking. ?75 minutes of vigorous exercise, such as running. ?General information ?Know how many calories are in the foods you eat most often. This will help you calculate calorie counts faster. ?Find a way of tracking calories that works for you. Get creative. Try different apps or programs if writing down calories does not work for you. ?What foods should I eat? ? ?Eat nutritious foods. It is better to have a nutritious, high-calorie food, such as an avocado, than a food with  few nutrients, such as a bag of potato chips. ?Use your calories on foods and drinks that will fill you up and will not leave you hungry soon after eating. ?Examples of foods that fill you up are nuts and nut butters, vegetables, lean proteins, and high-fiber foods such as whole grains. High-fiber foods are foods with more than 5 g of fiber per serving. ?Pay attention to calories in drinks. Low-calorie drinks include water and unsweetened drinks. ?The items listed above may not be a complete list of foods and beverages you can eat. Contact a dietitian for more information. ?What foods should I limit? ?Limit foods or drinks that are not good sources of vitamins, minerals, or protein or that are high in unhealthy fats. These include: ?  Candy. ?Other sweets. ?Sodas, specialty coffee drinks, alcohol, and juice. ?The items listed above may not be a complete list of foods and beverages you should avoid. Contact a dietitian for more information. ?How do I count calories when eating out? ?Pay attention to portions. Often, portions are much larger when eating out. Try these tips to keep portions smaller: ?Consider sharing a meal instead of getting your own. ?If you get your own meal, eat only half of it. Before you start eating, ask for a container and put half of your meal into it. ?When available, consider ordering smaller portions from the menu instead of full portions. ?Pay attention to your food and drink choices. Knowing the way food is cooked and what is included with the meal can help you eat fewer calories. ?If calories are listed on the menu, choose the lower-calorie options. ?Choose dishes that include vegetables, fruits, whole grains, low-fat dairy products, and lean proteins. ?Choose items that are boiled, broiled, grilled, or steamed. Avoid items that are buttered, battered, fried, or served with cream sauce. Items labeled as crispy are usually fried, unless stated otherwise. ?Choose water, low-fat milk,  unsweetened iced tea, or other drinks without added sugar. If you want an alcoholic beverage, choose a lower-calorie option, such as a glass of wine or light beer. ?Ask for dressings, sauces, and syrups on the side. T

## 2022-03-31 NOTE — Progress Notes (Signed)
Needs referral to OBGYN had fibroids removed and was told that fibroid had pushed her uterus back  ?States she has real bad GERD that causes sharp pains making her feel like she is about to die ?

## 2022-04-15 ENCOUNTER — Ambulatory Visit (INDEPENDENT_AMBULATORY_CARE_PROVIDER_SITE_OTHER): Payer: 59 | Admitting: Primary Care

## 2022-05-12 ENCOUNTER — Ambulatory Visit (INDEPENDENT_AMBULATORY_CARE_PROVIDER_SITE_OTHER): Payer: Self-pay

## 2022-05-12 ENCOUNTER — Encounter (INDEPENDENT_AMBULATORY_CARE_PROVIDER_SITE_OTHER): Payer: Self-pay | Admitting: Primary Care

## 2022-05-12 NOTE — Telephone Encounter (Signed)
?  Chief Complaint: abdominal pain ?Symptoms: abdominal pain in lower abdomen, comes and goes, 9/10 ?Frequency: 1 week ?Pertinent Negatives:  ?Disposition: '[]'$ ED /'[]'$ Urgent Care (no appt availability in office) / '[x]'$ Appointment(In office/virtual)/ '[]'$  [facility]/ '[]'$ Home Care/ '[]'$ Refused Recommended Disposition /'[]'$  Mobile [facility]/ '[]'$  Follow-up with PCP ?Additional Notes: pt states she had surgery 3 years ago and still feels severe pain in her lower abdomen like she has stitches there. She has to hold the area when she coughs or sneezes d/t pain. Pt also has R eye that is swollen and red and draining d/t possibly allergies but is unsure. Pt states she has been using warm compress and eye drops OTC that help some but not a lot. I scheduled pt for appt with PCP tomorrow 05/13/22 at 1410.  ? ?Reason for Disposition ? [1] MODERATE pain (e.g., interferes with normal activities) AND [2] pain comes and goes (cramps) AND [3] present > 24 hours  (Exception: pain with Vomiting or Diarrhea - see that Guideline) ? ?Answer Assessment - Initial Assessment Questions ?1. LOCATION: "Where does it hurt?"  ?    Lower abdomen  ?3. ONSET: "When did the pain begin?" (e.g., minutes, hours or days ago)  ?    1 week, surgery 3 years ago  ?5. PATTERN "Does the pain come and go, or is it constant?" ?   - If constant: "Is it getting better, staying the same, or worsening?"  ?    (Note: Constant means the pain never goes away completely; most serious pain is constant and it progresses)  ?   - If intermittent: "How long does it last?" "Do you have pain now?" ?    (Note: Intermittent means the pain goes away completely between bouts) ?    Comes and goes  ?6. SEVERITY: "How bad is the pain?"  (e.g., Scale 1-10; mild, moderate, or severe) ?  - MILD (1-3): doesn't interfere with normal activities, abdomen soft and not tender to touch  ?  - MODERATE (4-7): interferes with normal activities or awakens from sleep, abdomen tender to touch   ?  - SEVERE (8-10): excruciating pain, doubled over, unable to do any normal activities  ?    9 ?10. OTHER SYMPTOMS: "Do you have any other symptoms?" (e.g., back pain, diarrhea, fever, urination pain, vomiting) ?      no ? ?Protocols used: Abdominal Pain - Female-A-AH ? ?

## 2022-05-13 ENCOUNTER — Ambulatory Visit (INDEPENDENT_AMBULATORY_CARE_PROVIDER_SITE_OTHER): Payer: 59 | Admitting: Primary Care

## 2022-05-13 ENCOUNTER — Encounter (INDEPENDENT_AMBULATORY_CARE_PROVIDER_SITE_OTHER): Payer: Self-pay | Admitting: Primary Care

## 2022-05-13 VITALS — BP 135/86 | HR 92 | Temp 98.4°F | Ht 64.0 in | Wt 220.8 lb

## 2022-05-13 DIAGNOSIS — B309 Viral conjunctivitis, unspecified: Secondary | ICD-10-CM | POA: Diagnosis not present

## 2022-05-13 DIAGNOSIS — I10 Essential (primary) hypertension: Secondary | ICD-10-CM | POA: Diagnosis not present

## 2022-05-13 DIAGNOSIS — J029 Acute pharyngitis, unspecified: Secondary | ICD-10-CM

## 2022-05-13 DIAGNOSIS — R109 Unspecified abdominal pain: Secondary | ICD-10-CM

## 2022-05-13 DIAGNOSIS — J302 Other seasonal allergic rhinitis: Secondary | ICD-10-CM

## 2022-05-13 MED ORDER — FLUTICASONE PROPIONATE 50 MCG/ACT NA SUSP: 2.0000 | Freq: Every day | NASAL | 6 refills | Status: AC

## 2022-05-13 MED ORDER — LORATADINE 10 MG PO TABS: 10.0000 mg | ORAL_TABLET | Freq: Every day | ORAL | 11 refills | Status: DC

## 2022-05-13 MED ORDER — CIPROFLOXACIN HCL 0.3 % OP OINT: TOPICAL_OINTMENT | Freq: Two times a day (BID) | OPHTHALMIC | 0 refills | Status: AC

## 2022-05-13 NOTE — Patient Instructions (Signed)
Allergic Rhinitis, Adult  Allergic rhinitis is an allergic reaction that affects the mucous membrane inside the nose. The mucous membrane is the tissue that produces mucus. There are two types of allergic rhinitis: Seasonal. This type is also called hay fever and happens only during certain seasons. Perennial. This type can happen at any time of the year. Allergic rhinitis cannot be spread from person to person. This condition can be mild, moderate, or severe. It can develop at any age and may be outgrown. What are the causes? This condition is caused by allergens. These are things that can cause an allergic reaction. Allergens may differ for seasonal allergic rhinitis and perennial allergic rhinitis. Seasonal allergic rhinitis is triggered by pollen. Pollen can come from grasses, trees, and weeds. Perennial allergic rhinitis may be triggered by: Dust mites. Proteins in a pet's urine, saliva, or dander. Dander is dead skin cells from a pet. Smoke, mold, or car fumes. What increases the risk? You are more likely to develop this condition if you have a family history of allergies or other conditions related to allergies, including: Allergic conjunctivitis. This is inflammation of parts of the eyes and eyelids. Asthma. This condition affects the lungs and makes it hard to breathe. Atopic dermatitis or eczema. This is long term (chronic) inflammation of the skin. Food allergies. What are the signs or symptoms? Symptoms of this condition include: Sneezing or coughing. A stuffy nose (nasal congestion), itchy nose, or nasal discharge. Itchy eyes and tearing of the eyes. A feeling of mucus dripping down the back of your throat (postnasal drip). Trouble sleeping. Tiredness or fatigue. Headache. Sore throat. How is this diagnosed? This condition may be diagnosed with your symptoms, medical history, and physical exam. Your health care provider may check for related conditions, such  as: Asthma. Pink eye. This is eye inflammation caused by infection (conjunctivitis). Ear infection. Upper respiratory infection. This is an infection in the nose, throat, or upper airways. You may also have tests to find out which allergens trigger your symptoms. These may include skin tests or blood tests. How is this treated? There is no cure for this condition, but treatment can help control symptoms. Treatment may include: Taking medicines that block allergy symptoms, such as corticosteroids and antihistamines. Medicine may be given as a shot, nasal spray, or pill. Avoiding any allergens. Being exposed again and again to tiny amounts of allergens to help you build a defense against allergens (immunotherapy). This is done if other treatments have not helped. It may include: Allergy shots. These are injected medicines that have small amounts of allergen in them. Sublingual immunotherapy. This involves taking small doses of a medicine with allergen in it under your tongue. If these treatments do not work, your health care provider may prescribe newer, stronger medicines. Follow these instructions at home: Avoiding allergens Find out what you are allergic to and avoid those allergens. These are some things you can do to help avoid allergens: If you have perennial allergies: Replace carpet with wood, tile, or vinyl flooring. Carpet can trap dander and dust. Do not smoke. Do not allow smoking in your home. Change your heating and air conditioning filters at least once a month. If you have seasonal allergies, take these steps during allergy season: Keep windows closed as much as possible. Plan outdoor activities when pollen counts are lowest. Check pollen counts before you plan outdoor activities. When coming indoors, change clothing and shower before sitting on furniture or bedding. If you have a pet   in the house that produces allergens: Keep the pet out of the bedroom. Vacuum, sweep, and  dust regularly. General instructions Take over-the-counter and prescription medicines only as told by your health care provider. Drink enough fluid to keep your urine pale yellow. Keep all follow-up visits as told by your health care provider. This is important. Where to find more information American Academy of Allergy, Asthma & Immunology: www.aaaai.org Contact a health care provider if: You have a fever. You develop a cough that does not go away. You make whistling sounds when you breathe (wheeze). Your symptoms slow you down or stop you from doing your normal activities each day. Get help right away if: You have shortness of breath. This symptom may represent a serious problem that is an emergency. Do not wait to see if the symptom will go away. Get medical help right away. Call your local emergency services (911 in the [place] Do not drive yourself to the hospital. Summary Allergic rhinitis may be managed by taking medicines as directed and avoiding allergens. If you have seasonal allergies, keep windows closed as much as possible during allergy season. Contact your health care provider if you develop a fever or a cough that does not go away. This information is not intended to replace advice given to you by your health care provider. Make sure you discuss any questions you have with your health care provider. Document Revised: 02/02/2020 Document Reviewed: 12/12/2019 Elsevier Patient Education  2023 [employer] Inc.  

## 2022-05-13 NOTE — Progress Notes (Signed)
?Stefanie Moon ? ?Stefanie Moon, is a 51 y.o. female ? ?[id number] ? ?[id number] ? ?DOB - 1971-05-03 ? ?Chief Complaint  ?Patient presents with  ? Edema  ?  Right eye. Throat pain. Excess mucus   ? Abdominal Pain  ?    ? ?Subjective:  ? ?Stefanie Moon is a 51 y.o. female here today for a follow up visit. Patient has No headache, No chest pain, No abdominal pain - No Nausea, No new weakness tingling or numbness, No Cough - shortness of breath ? ?No problems updated. ? ?Allergies  ?Allergen Reactions  ? Tramadol Hives and Rash  ? ? ?Past Medical History:  ?Diagnosis Date  ? Anemia   ? Arthritis   ? knee  ? Blood transfusion without reported diagnosis   ? Fibroids   ? GERD (gastroesophageal reflux disease)   ? Hypertension   ? ? ?Current Outpatient Medications on File Prior to Visit  ?Medication Sig Dispense Refill  ? amLODipine (NORVASC) 10 MG tablet Take 1 tablet (10 mg total) by mouth daily. 90 tablet 1  ? hydrochlorothiazide (HYDRODIURIL) 25 MG tablet Take 1 tablet (25 mg total) by mouth daily. 90 tablet 3  ? pantoprazole (PROTONIX) 40 MG tablet Take 1 tablet (40 mg total) by mouth daily. 30 tablet 3  ? acidophilus (RISAQUAD) CAPS capsule Take 1 capsule by mouth daily.    ? diclofenac Sodium (VOLTAREN) 1 % GEL Apply 2 g topically 2 (two) times daily as needed (pain).    ? methocarbamol (ROBAXIN) 500 MG tablet Take 1 tablet (500 mg total) by mouth every 6 (six) hours as needed for muscle spasms. 60 tablet 1  ? [DISCONTINUED] Esomeprazole Magnesium (NEXIUM PO) Take by mouth daily.    ? [DISCONTINUED] lisinopril (ZESTRIL) 20 MG tablet Take 20 mg by mouth daily.    ? ?No current facility-administered medications on file prior to visit.  ? ? ?Objective:  ? ?Vitals:  ? 05/13/22 1412  ?BP: 135/86  ?Pulse: 92  ?Temp: 98.4 ?F (36.9 ?C)  ?TempSrc: Oral  ?SpO2: 97%  ?Weight: 220 lb 12.8 oz (100.2 kg)  ?Height: '5\' 4"'$  (1.626 m)  ? ? ?Exam ?General appearance : Awake, alert, not in any distress. Speech  Clear. Not toxic looking ?HEENT: Atraumatic and Normocephalic, pupils equally reactive to light and accomodation, right eye red ,crusted , itchy  ?Neck: Supple, no JVD. Superficial cervical lymphadenopathy/tender and erythema .  ?Chest: Good air entry bilaterally, no added sounds  ?CVS: S1 S2 regular, no murmurs.  ?Abdomen: Bowel sounds present, Non tender and not distended with no gaurding, rigidity or rebound. ?Extremities: B/L Lower Ext shows no edema at this time , both legs are warm to touch ?Neurology: Awake alert, and oriented X 3,  Non focal ?Skin: No Rash ? ?Data Review ?Lab Results  ?Component Value Date  ? HGBA1C 5.4 03/31/2022  ? ? ?Assessment & Plan  ?Ninfa was seen today for edema and abdominal pain. ? ?Diagnoses and all orders for this visit: ? ?Abdominal pain, unspecified abdominal location ?Lower abdominal pain, left upper abdominal pain, constipation and nausea and vomiting.  Referred to gastrology to evaluate for any causes of problems ? ?Essential hypertension ?Blood pressure has today 135/86, continue to follow low-sodium, DASH diet, medication compliance, 150 minutes of moderate intensity exercise per week. ?Discussed medication compliance, adverse effects.  ? ?Acute pharyngitis, unspecified etiology ?Superficial cervical lymph nodes -palpated enlarged and tender with pain. ?Treat with antibiotics ? ?Acute bacterial conjunctivitis of right eye ?ciprofloxacin (CILOXAN)  0.3 % ophthalmic ointment  BID ? ?Seasonal allergies ?-     fluticasone (FLONASE) 50 MCG/ACT nasal spray; Place 2 sprays into both nostrils daily. ?-     loratadine (CLARITIN) 10 MG tablet; Take 1 tablet (10 mg total) by mouth daily. ? ?Other orders ?-     ciprofloxacin (CILOXAN) 0.3 % ophthalmic ointment; Place into the right eye 2 (two) times daily. ? ?  ? ?Patient have been counseled extensively about nutrition and exercise. Other issues discussed during this visit include: low cholesterol diet, weight control and daily  exercise, foot care, annual eye examinations at Ophthalmology, importance of adherence with medications and regular follow-up. We also discussed long term complications of uncontrolled diabetes and hypertension.  ? ?No follow-ups on file. ? ?The patient was given clear instructions to go to ER or return to medical center if symptoms don't improve, worsen or new problems develop. The patient verbalized understanding. The patient was told to call to get lab results if they haven't heard anything in the next week.  ? ?This note has been created with [employer], quantity. Any transcriptional errors are unintentional.  ? ?Kerin Perna, NP ?05/13/2022, 2:34 PM ? ?

## 2022-05-19 ENCOUNTER — Encounter: Payer: Self-pay | Admitting: Nurse Practitioner

## 2022-05-27 ENCOUNTER — Ambulatory Visit: Payer: 59 | Admitting: Orthopaedic Surgery

## 2022-05-27 ENCOUNTER — Encounter: Payer: Self-pay | Admitting: Orthopaedic Surgery

## 2022-05-27 ENCOUNTER — Ambulatory Visit (INDEPENDENT_AMBULATORY_CARE_PROVIDER_SITE_OTHER): Payer: 59

## 2022-05-27 ENCOUNTER — Ambulatory Visit: Payer: Self-pay

## 2022-05-27 DIAGNOSIS — M25561 Pain in right knee: Secondary | ICD-10-CM | POA: Diagnosis not present

## 2022-05-27 DIAGNOSIS — M25562 Pain in left knee: Secondary | ICD-10-CM | POA: Diagnosis not present

## 2022-05-27 DIAGNOSIS — M1711 Unilateral primary osteoarthritis, right knee: Secondary | ICD-10-CM

## 2022-05-27 DIAGNOSIS — M1712 Unilateral primary osteoarthritis, left knee: Secondary | ICD-10-CM | POA: Diagnosis not present

## 2022-05-27 MED ORDER — BUPIVACAINE HCL 0.5 % IJ SOLN
2.0000 mL | INTRAMUSCULAR | Status: AC | PRN
  Administered 2022-05-27: 2 mL via INTRA_ARTICULAR

## 2022-05-27 MED ORDER — METHYLPREDNISOLONE ACETATE 40 MG/ML IJ SUSP
40.0000 mg | INTRAMUSCULAR | Status: AC | PRN
  Administered 2022-05-27: 40 mg via INTRA_ARTICULAR

## 2022-05-27 MED ORDER — LIDOCAINE HCL 1 % IJ SOLN
2.0000 mL | INTRAMUSCULAR | Status: AC | PRN
  Administered 2022-05-27: 2 mL

## 2022-05-27 NOTE — Progress Notes (Signed)
Office Visit Note   Patient: Stefanie Moon           Date of Birth: [birth year]           MRN: [id number] Visit Date: 05/27/2022              Requested by: No referring provider defined for this encounter. PCP: Pcp, No   Assessment & Plan: Visit Diagnoses:  1. Primary osteoarthritis of left knee   2. Primary osteoarthritis of right knee     Plan: Impression is bilateral knee advanced DJD worse on the left.  Overall sounds like the effusion is very symptomatic.  I was able to aspirate 15 cc of inflamed synovial fluid from the left knee.  Cortisone was injected.  We did not inject the right knee today.  She understands that she has bone-on-bone arthritis and is likely facing a knee replacement sometime in the future.  Follow-up as needed.  She has my card.  Follow-Up Instructions: No follow-ups on file.   Orders:  Orders Placed This Encounter  Procedures   Large Joint Inj   XR KNEE 3 VIEW RIGHT   XR KNEE 3 VIEW LEFT   No orders of the defined types were placed in this encounter.     Procedures: Large Joint Inj: L knee on 05/27/2022 9:25 AM Details: 22 G needle Medications: 2 mL bupivacaine 0.5 %; 2 mL lidocaine 1 %; 40 mg methylPREDNISolone acetate 40 MG/ML Outcome: tolerated well, no immediate complications Patient was prepped and draped in the usual sterile fashion.      Clinical Data: No additional findings.   Subjective: Chief Complaint  Patient presents with   Right Knee - Pain   Left Knee - Pain    HPI Stefanie Moon comes in today for bilateral knee pain worse on the left.  Reports effusion and pressure that is worse with standing and walking.  Underwent left knee scope with medial and lateral meniscal debridement by Dr. Alma Friendly in July 2022.  Since then has had issues with swelling and pain.  Has undergone a couple of aspirations and injections by Dr. Alma Friendly.  Her health insurance change and has switched her care to our office.  Review of Systems   Constitutional: Negative.   HENT: Negative.    Eyes: Negative.   Respiratory: Negative.    Cardiovascular: Negative.   Endocrine: Negative.   Musculoskeletal: Negative.   Neurological: Negative.   Hematological: Negative.   Psychiatric/Behavioral: Negative.    All other systems reviewed and are negative.   Objective: Vital Signs: LMP 08/29/2020   Physical Exam Vitals and nursing note reviewed.  Constitutional:      Appearance: She is well-developed.  HENT:     Head: Normocephalic and atraumatic.  Pulmonary:     Effort: Pulmonary effort is normal.  Abdominal:     Palpations: Abdomen is soft.  Musculoskeletal:     Cervical back: Neck supple.  Skin:    General: Skin is warm.     Capillary Refill: Capillary refill takes less than 2 seconds.  Neurological:     Mental Status: She is alert and oriented to person, place, and time.  Psychiatric:        Behavior: Behavior normal.        Thought Content: Thought content normal.        Judgment: Judgment normal.    Ortho Exam Examination of the left knee shows moderate to joint effusion with pain with range of motion and crepitus.  Collaterals and cruciates are stable. Examination of right knee shows a trace effusion with pain with range of motion and crepitus.  Collaterals and cruciates are stable. Specialty Comments:  No specialty comments available.  Imaging: XR KNEE 3 VIEW LEFT  Result Date: 05/27/2022 Advanced tricompartmental degenerative joint disease.  Bone-on-bone joint space narrowing.  XR KNEE 3 VIEW RIGHT  Result Date: 05/27/2022 Advanced tricompartmental degenerative joint disease.  Bone-on-bone joint space narrowing.    PMFS History: Patient Active Problem List   Diagnosis Date Noted   Primary osteoarthritis of left knee 05/27/2022   Primary osteoarthritis of right knee 05/27/2022   Uterine leiomyoma 09/03/2020   S/P abdominal hysterectomy 09/03/2020   Past Medical History:  Diagnosis Date   Anemia     Arthritis    knee   Blood transfusion without reported diagnosis    Fibroids    GERD (gastroesophageal reflux disease)    Hypertension     History reviewed. No pertinent family history.  Past Surgical History:  Procedure Laterality Date   APPENDECTOMY     CYSTOSCOPY N/A 09/03/2020   Procedure: CYSTOSCOPY;  Surgeon: Cheri Fowler, MD;  Location: [facility];  Service: Gynecology;  Laterality: N/A;   HYSTERECTOMY ABDOMINAL WITH SALPINGECTOMY Bilateral 09/03/2020   Procedure: HYSTERECTOMY ABDOMINAL WITH SALPINGECTOMY;  Surgeon: Cheri Fowler, MD;  Location: [facility];  Service: Gynecology;  Laterality: Bilateral;  TAP Block   KNEE ARTHROSCOPY Left 07/02/2021   Procedure: ARTHROSCOPY KNEE, LATERAL MENISECTOMY, CONDRAPLASTY, ASPIRATION OF BAKERS CYST;  Surgeon: Netta Cedars, MD;  Location: [facility];  Service: Orthopedics;  Laterality: Left;   Social History   Occupational History   Not on file  Tobacco Use   Smoking status: Never   Smokeless tobacco: Never  Vaping Use   Vaping Use: Never used  Substance and Sexual Activity   Alcohol use: Yes    Comment: social wine once a month   Drug use: No   Sexual activity: Yes    Birth control/protection: Pill

## 2022-05-28 ENCOUNTER — Ambulatory Visit (INDEPENDENT_AMBULATORY_CARE_PROVIDER_SITE_OTHER): Payer: 59 | Admitting: Primary Care

## 2022-05-28 LAB — SYNOVIAL FLUID ANALYSIS, COMPLETE
Basophils, %: 0 %
Eosinophils-Synovial: 2 % (ref 0–2)
Lymphocytes-Synovial Fld: 32 % (ref 0–74)
Monocyte/Macrophage: 5 % (ref 0–69)
Neutrophil, Synovial: 60 % — ABNORMAL HIGH (ref 0–24)
Synoviocytes, %: 1 % (ref 0–15)
WBC, Synovial: 477 cells/uL — ABNORMAL HIGH (ref <150)

## 2022-05-28 LAB — TIQ-NTM

## 2022-06-09 NOTE — Progress Notes (Signed)
06/09/2022 ANASTACIA REINECKE [id number] 10/03/1971   CHIEF COMPLAINT:   HISTORY OF PRESENT ILLNESS: Selena Swaminathan is a 51 year old female with a past medical history of arthritis, hypertension, anemia, uterine fibroids, gallstones, GERD  She presents to our office today as referred by Juluis Mire NP to schedule a screening colonoscopy.   On Ferrous Sulfate      Latest Ref Rng & Units 06/20/2021   11:17 AM 05/29/2021    8:49 AM 09/06/2020    2:03 AM  CBC  WBC 4.0 - 10.5 K/uL 8.5  5.2  13.6   Hemoglobin 12.0 - 15.0 g/dL 13.1  12.5  8.2   Hematocrit 36.0 - 46.0 % 41.1  39.2  27.3   Platelets 150 - 400 K/uL 247  236  282        Latest Ref Rng & Units 06/20/2021   11:17 AM 05/29/2021    8:49 AM 08/28/2020   12:15 PM  CMP  Glucose 70 - 99 mg/dL 99  95  99   BUN 6 - 20 mg/dL '12  13  10   '$ Creatinine 0.44 - 1.00 mg/dL 0.92  0.86  1.09   Sodium 135 - 145 mmol/L 137  135  136   Potassium 3.5 - 5.1 mmol/L 3.8  3.7  3.7   Chloride 98 - 111 mmol/L 102  102  103   CO2 22 - 32 mmol/L '28  26  26   '$ Calcium 8.9 - 10.3 mg/dL 9.0  8.7  9.3   Total Protein 6.5 - 8.1 g/dL   7.9   Total Bilirubin 0.3 - 1.2 mg/dL   0.9   Alkaline Phos 38 - 126 U/L   45   AST 15 - 41 U/L   19   ALT 0 - 44 U/L   17     Past Medical History:  Diagnosis Date   Anemia    Arthritis    knee   Blood transfusion without reported diagnosis    Fibroids    GERD (gastroesophageal reflux disease)    Hypertension    Past Surgical History:  Procedure Laterality Date   APPENDECTOMY     CYSTOSCOPY N/A 09/03/2020   Procedure: CYSTOSCOPY;  Surgeon: Cheri Fowler, MD;  Location: [facility];  Service: Gynecology;  Laterality: N/A;   HYSTERECTOMY ABDOMINAL WITH SALPINGECTOMY Bilateral 09/03/2020   Procedure: HYSTERECTOMY ABDOMINAL WITH SALPINGECTOMY;  Surgeon: Cheri Fowler, MD;  Location: [facility];  Service: Gynecology;  Laterality: Bilateral;  TAP Block   KNEE ARTHROSCOPY Left 07/02/2021   Procedure: ARTHROSCOPY KNEE,  LATERAL MENISECTOMY, CONDRAPLASTY, ASPIRATION OF BAKERS CYST;  Surgeon: Netta Cedars, MD;  Location: [facility];  Service: Orthopedics;  Laterality: Left;   Social History:  Family History:   Allergies  Allergen Reactions   Tramadol Hives and Rash      Outpatient Encounter Medications as of 06/10/2022  Medication Sig   acidophilus (RISAQUAD) CAPS capsule Take 1 capsule by mouth daily.   amLODipine (NORVASC) 10 MG tablet Take 1 tablet (10 mg total) by mouth daily.   ciprofloxacin (CILOXAN) 0.3 % ophthalmic ointment Place into the right eye 2 (two) times daily.   diclofenac Sodium (VOLTAREN) 1 % GEL Apply 2 g topically 2 (two) times daily as needed (pain).   fluticasone (FLONASE) 50 MCG/ACT nasal spray Place 2 sprays into both nostrils daily.   hydrochlorothiazide (HYDRODIURIL) 25 MG tablet Take 1 tablet (25 mg total) by mouth daily.   loratadine (CLARITIN) 10  MG tablet Take 1 tablet (10 mg total) by mouth daily.   methocarbamol (ROBAXIN) 500 MG tablet Take 1 tablet (500 mg total) by mouth every 6 (six) hours as needed for muscle spasms.   pantoprazole (PROTONIX) 40 MG tablet Take 1 tablet (40 mg total) by mouth daily.   [DISCONTINUED] Esomeprazole Magnesium (NEXIUM PO) Take by mouth daily.   [DISCONTINUED] lisinopril (ZESTRIL) 20 MG tablet Take 20 mg by mouth daily.   No facility-administered encounter medications on file as of 06/10/2022.   REVIEW OF SYSTEMS:  Gen: Denies fever, sweats or chills. No weight loss.  CV: Denies chest pain, palpitations or edema. Resp: Denies cough, shortness of breath of hemoptysis.  GI: Denies heartburn, dysphagia, stomach or lower abdominal pain. No diarrhea or constipation.  GU : Denies urinary burning, blood in urine, increased urinary frequency or incontinence. MS: Denies joint pain, muscles aches or weakness. Derm: Denies rash, itchiness, skin lesions or unhealing ulcers. Psych: Denies depression, anxiety, memory loss, suicidal ideation and  confusion. Heme: Denies bruising, easy bleeding. Neuro:  Denies headaches, dizziness or paresthesias. Endo:  Denies any problems with DM, thyroid or adrenal function.  PHYSICAL EXAM: LMP 08/29/2020  General: Well developed ... in no acute distress. Head: Normocephalic and atraumatic. Eyes:  Sclerae non-icteric, conjunctive pink. Ears: Normal auditory acuity. Mouth: Dentition intact. No ulcers or lesions.  Neck: Supple, no lymphadenopathy or thyromegaly.  Lungs: Clear bilaterally to auscultation without wheezes, crackles or rhonchi. Heart: Regular rate and rhythm. No murmur, rub or gallop appreciated.  Abdomen: Soft, nontender, non distended. No masses. No hepatosplenomegaly. Normoactive bowel sounds x 4 quadrants.  Rectal:  Musculoskeletal: Symmetrical with no gross deformities. Skin: Warm and dry. No rash or lesions on visible extremities. Extremities: No edema. Neurological: Alert oriented x 4, no focal deficits.  Psychological:  Alert and cooperative. Normal mood and affect.  ASSESSMENT AND PLAN:    CC:  Kerin Perna, NP

## 2022-06-10 ENCOUNTER — Ambulatory Visit: Payer: 59 | Admitting: Nurse Practitioner

## 2022-06-10 ENCOUNTER — Encounter: Payer: Self-pay | Admitting: Nurse Practitioner

## 2022-06-10 ENCOUNTER — Other Ambulatory Visit (INDEPENDENT_AMBULATORY_CARE_PROVIDER_SITE_OTHER): Payer: 59

## 2022-06-10 VITALS — BP 126/90 | HR 68 | Ht 64.0 in | Wt 219.0 lb

## 2022-06-10 DIAGNOSIS — D509 Iron deficiency anemia, unspecified: Secondary | ICD-10-CM | POA: Diagnosis not present

## 2022-06-10 DIAGNOSIS — K219 Gastro-esophageal reflux disease without esophagitis: Secondary | ICD-10-CM

## 2022-06-10 DIAGNOSIS — R1310 Dysphagia, unspecified: Secondary | ICD-10-CM | POA: Diagnosis not present

## 2022-06-10 DIAGNOSIS — K5900 Constipation, unspecified: Secondary | ICD-10-CM | POA: Diagnosis not present

## 2022-06-10 LAB — CBC
HCT: 39 % (ref 36.0–46.0)
Hemoglobin: 13.2 g/dL (ref 12.0–15.0)
MCHC: 33.9 g/dL (ref 30.0–36.0)
MCV: 87.4 fl (ref 78.0–100.0)
Platelets: 215 10*3/uL (ref 150.0–400.0)
RBC: 4.47 Mil/uL (ref 3.87–5.11)
RDW: 13.8 % (ref 11.5–15.5)
WBC: 9 10*3/uL (ref 4.0–10.5)

## 2022-06-10 LAB — COMPREHENSIVE METABOLIC PANEL
ALT: 19 U/L (ref 0–35)
AST: 17 U/L (ref 0–37)
Albumin: 3.9 g/dL (ref 3.5–5.2)
Alkaline Phosphatase: 59 U/L (ref 39–117)
BUN: 12 mg/dL (ref 6–23)
CO2: 28 mEq/L (ref 19–32)
Calcium: 9.2 mg/dL (ref 8.4–10.5)
Chloride: 103 mEq/L (ref 96–112)
Creatinine, Ser: 1.12 mg/dL (ref 0.40–1.20)
GFR: 57.55 mL/min — ABNORMAL LOW (ref >60.00)
Glucose, Bld: 104 mg/dL — ABNORMAL HIGH (ref 70–99)
Potassium: 4.1 mEq/L (ref 3.5–5.1)
Sodium: 136 mEq/L (ref 135–145)
Total Bilirubin: 0.6 mg/dL (ref 0.2–1.2)
Total Protein: 7.6 g/dL (ref 6.0–8.3)

## 2022-06-10 LAB — IBC + FERRITIN
Ferritin: 43.7 ng/mL (ref 10.0–291.0)
Iron: 66 ug/dL (ref 42–145)
Saturation Ratios: 20.2 % (ref 20.0–50.0)
TIBC: 326.2 ug/dL (ref 250.0–450.0)
Transferrin: 233 mg/dL (ref 212.0–360.0)

## 2022-06-10 MED ORDER — FAMOTIDINE 20 MG PO TABS: 20.0000 mg | ORAL_TABLET | Freq: Every day | ORAL | 2 refills | Status: AC

## 2022-06-10 MED ORDER — NA SULFATE-K SULFATE-MG SULF 17.5-3.13-1.6 GM/177ML PO SOLN: 1.0000 | ORAL | 0 refills | Status: DC

## 2022-06-10 NOTE — Patient Instructions (Addendum)
Apply a small amount of Desitin inside the anal opening and to the external anal area tid as needed for anal or hemorrhoidal irritation/bleeding.   Your provider has requested that you go to the basement level for lab work before leaving today. Press "B" on the elevator. The lab is located at the first door on the left as you exit the elevator.  Continue Pantoprazole '40mg'$ - once daily.  We have sent the following medications to your pharmacy for you to pick up at your convenience: Suprep , Pepcid   Use Preparation H suppositories - Insert 1 suppository nightly for 5 nights.   Use Miralax - dissolve 1 capful in at least 8 ounces of water daily at bedtime.     You have been scheduled for an endoscopy and colonoscopy. Please follow the written instructions given to you at your visit today. Please pick up your prep supplies at the pharmacy within the next 1-3 days. If you use inhalers (even only as needed), please bring them with you on the day of your procedure.   Thank you for choosing me and [facility].   Gastroesophageal Reflux Disease, Adult Gastroesophageal reflux (GER) happens when acid from the stomach flows up into the tube that connects the mouth and the stomach (esophagus). Normally, food travels down the esophagus and stays in the stomach to be digested. However, when a person has GER, food and stomach acid sometimes move back up into the esophagus. If this becomes a more serious problem, the person may be diagnosed with a disease called gastroesophageal reflux disease (GERD). GERD occurs when the reflux: Happens often. Causes frequent or severe symptoms. Causes problems such as damage to the esophagus. When stomach acid comes in contact with the esophagus, the acid may cause inflammation in the esophagus. Over time, GERD may create small holes (ulcers) in the lining of the esophagus. What are the causes? This condition is caused by a problem with the muscle between  the esophagus and the stomach (lower esophageal sphincter, or LES). Normally, the LES muscle closes after food passes through the esophagus to the stomach. When the LES is weakened or abnormal, it does not close properly, and that allows food and stomach acid to go back up into the esophagus. The LES can be weakened by certain dietary substances, medicines, and medical conditions, including: Tobacco use. Pregnancy. Having a hiatal hernia. Alcohol use. Certain foods and beverages, such as coffee, chocolate, onions, and peppermint. What increases the risk? You are more likely to develop this condition if you: Have an increased body weight. Have a connective tissue disorder. Take NSAIDs, such as ibuprofen. What are the signs or symptoms? Symptoms of this condition include: Heartburn. Difficult or painful swallowing and the feeling of having a lump in the throat. A bitter taste in the mouth. Bad breath and having a large amount of saliva. Having an upset or bloated stomach and belching. Chest pain. Different conditions can cause chest pain. Make sure you see your health care provider if you experience chest pain. Shortness of breath or wheezing. Ongoing (chronic) cough or a nighttime cough. Wearing away of tooth enamel. Weight loss. How is this diagnosed? This condition may be diagnosed based on a medical history and a physical exam. To determine if you have mild or severe GERD, your health care provider may also monitor how you respond to treatment. You may also have tests, including: A test to examine your stomach and esophagus with a small camera (endoscopy). A test that  measures the acidity level in your esophagus. A test that measures how much pressure is on your esophagus. A barium swallow or modified barium swallow test to show the shape, size, and functioning of your esophagus. How is this treated? Treatment for this condition may vary depending on how severe your symptoms are.  Your health care provider may recommend: Changes to your diet. Medicine. Surgery. The goal of treatment is to help relieve your symptoms and to prevent complications. Follow these instructions at home: Eating and drinking  Follow a diet as recommended by your health care provider. This may involve avoiding foods and drinks such as: Coffee and tea, with or without caffeine. Drinks that contain alcohol. Energy drinks and sports drinks. Carbonated drinks or sodas. Chocolate and cocoa. Peppermint and mint flavorings. Garlic and onions. Horseradish. Spicy and acidic foods, including peppers, chili powder, curry powder, vinegar, hot sauces, and barbecue sauce. Citrus fruit juices and citrus fruits, such as oranges, lemons, and limes. Tomato-based foods, such as red sauce, chili, salsa, and pizza with red sauce. Fried and fatty foods, such as donuts, french fries, potato chips, and high-fat dressings. High-fat meats, such as hot dogs and fatty cuts of red and white meats, such as rib eye steak, sausage, ham, and bacon. High-fat dairy items, such as whole milk, butter, and cream cheese. Eat small, frequent meals instead of large meals. Avoid drinking large amounts of liquid with your meals. Avoid eating meals during the 2-3 hours before bedtime. Avoid lying down right after you eat. Do not exercise right after you eat. Lifestyle  Do not use any products that contain nicotine or tobacco. These products include cigarettes, chewing tobacco, and vaping devices, such as e-cigarettes. If you need help quitting, ask your health care provider. Try to reduce your stress by using methods such as yoga or meditation. If you need help reducing stress, ask your health care provider. If you are overweight, reduce your weight to an amount that is healthy for you. Ask your health care provider for guidance about a safe weight loss goal. General instructions Pay attention to any changes in your  symptoms. Take over-the-counter and prescription medicines only as told by your health care provider. Do not take aspirin, ibuprofen, or other NSAIDs unless your health care provider told you to take these medicines. Wear loose-fitting clothing. Do not wear anything tight around your waist that causes pressure on your abdomen. Raise (elevate) the head of your bed about 6 inches (15 cm). You can use a wedge to do this. Avoid bending over if this makes your symptoms worse. Keep all follow-up visits. This is important. Contact a health care provider if: You have: New symptoms. Unexplained weight loss. Difficulty swallowing or it hurts to swallow. Wheezing or a persistent cough. A hoarse voice. Your symptoms do not improve with treatment. Get help right away if: You have sudden pain in your arms, neck, jaw, teeth, or back. You suddenly feel sweaty, dizzy, or light-headed. You have chest pain or shortness of breath. You vomit and the vomit is green, yellow, or black, or it looks like blood or coffee grounds. You faint. You have stool that is red, bloody, or black. You cannot swallow, drink, or eat. These symptoms may represent a serious problem that is an emergency. Do not wait to see if the symptoms will go away. Get medical help right away. Call your local emergency services (911 in the [place] Do not drive yourself to the hospital. Summary Gastroesophageal reflux happens when  acid from the stomach flows up into the esophagus. GERD is a disease in which the reflux happens often, causes frequent or severe symptoms, or causes problems such as damage to the esophagus. Treatment for this condition may vary depending on how severe your symptoms are. Your health care provider may recommend diet and lifestyle changes, medicine, or surgery. Contact a health care provider if you have new or worsening symptoms. Take over-the-counter and prescription medicines only as told by your health care provider.  Do not take aspirin, ibuprofen, or other NSAIDs unless your health care provider told you to do so. Keep all follow-up visits as told by your health care provider. This is important. This information is not intended to replace advice given to you by your health care provider. Make sure you discuss any questions you have with your health care provider. Document Revised: 06/24/2020 Document Reviewed: 06/24/2020 Elsevier Patient Education  [place].

## 2022-06-11 NOTE — Progress Notes (Signed)
____________________________________________________________  Attending physician addendum:  Thank you for sending this case to me. I have reviewed the entire note and agree with the plan.   Glennice Marcos Danis, MD  ____________________________________________________________  

## 2022-06-24 ENCOUNTER — Encounter: Payer: Self-pay | Admitting: Gastroenterology

## 2022-07-01 ENCOUNTER — Telehealth: Payer: Self-pay | Admitting: Gastroenterology

## 2022-07-01 DIAGNOSIS — K219 Gastro-esophageal reflux disease without esophagitis: Secondary | ICD-10-CM

## 2022-07-01 DIAGNOSIS — K5900 Constipation, unspecified: Secondary | ICD-10-CM

## 2022-07-01 DIAGNOSIS — D509 Iron deficiency anemia, unspecified: Secondary | ICD-10-CM

## 2022-07-01 NOTE — Addendum Note (Signed)
Addended by: Burman Foster on: 07/01/2022 12:36 PM   Modules accepted: Orders

## 2022-07-01 NOTE — Telephone Encounter (Signed)
Good morning Dr. Loletha Carrow,  Please advise.

## 2022-07-01 NOTE — Telephone Encounter (Addendum)
Considering that and her reported chronic constipation, her procedures should be rescheduled with closer attention to the preprocedure dietary restrictions, and the following addition to her bowel preparation instructions:  For that rescheduled exam, she needs to take MiraLAX 1 capful daily for 3 days before the prep day, and take 20 mg of Dulcolax 1 hour before starting the evening dose of Suprep.  If there is another short notice procedure cancellation, she will be charged a fee.  - HD

## 2022-07-01 NOTE — Telephone Encounter (Signed)
Called and spoke with patient. Advised pt of MD recommendations. Rescheduled pt to 08/07/22 at 1:30pm. Went over new prep instructions in detail over the phone. Will send new instructions via mychart as well. Pt verbalized understanding of all instructions and had no further concerns at the end of the call.

## 2022-07-01 NOTE — Telephone Encounter (Signed)
Inbound call from patient stating that she is scheduled to have a endoscopy and colonoscopy for tomorrow at 2:30. Patient stated she just so happened to look at her prep instructions and noticed where it said not to eat nuts, seeds, popcorn, etc starting on 6/29 and no solid foods today. Patient stated that she has been eating things she was not supposed to eat and also had a big breakfast this morning. Patient is requesting a call back to discuss if she needs to reschedule procedure. Please advise.

## 2022-07-02 ENCOUNTER — Encounter: Payer: 59 | Admitting: Gastroenterology

## 2022-07-28 ENCOUNTER — Telehealth: Payer: Self-pay

## 2022-07-28 DIAGNOSIS — K5900 Constipation, unspecified: Secondary | ICD-10-CM

## 2022-07-28 DIAGNOSIS — D509 Iron deficiency anemia, unspecified: Secondary | ICD-10-CM

## 2022-07-28 MED ORDER — NA SULFATE-K SULFATE-MG SULF 17.5-3.13-1.6 GM/177ML PO SOLN: 1.0000 | Freq: Once | ORAL | 0 refills | Status: AC

## 2022-07-28 NOTE — Telephone Encounter (Signed)
Called patient as a reminder of her upcoming procedure.  Patient requested her order for her prep be resent to her pharmacy.  Order placed at time of the call.

## 2022-07-31 ENCOUNTER — Encounter: Payer: Self-pay | Admitting: Certified Registered Nurse Anesthetist

## 2022-08-07 ENCOUNTER — Ambulatory Visit (AMBULATORY_SURGERY_CENTER): Payer: 59 | Admitting: Gastroenterology

## 2022-08-07 ENCOUNTER — Encounter: Payer: Self-pay | Admitting: Gastroenterology

## 2022-08-07 VITALS — BP 142/91 | HR 71 | Temp 97.5°F | Resp 12 | Ht 64.0 in | Wt 219.0 lb

## 2022-08-07 DIAGNOSIS — K219 Gastro-esophageal reflux disease without esophagitis: Secondary | ICD-10-CM | POA: Diagnosis present

## 2022-08-07 DIAGNOSIS — D509 Iron deficiency anemia, unspecified: Secondary | ICD-10-CM

## 2022-08-07 DIAGNOSIS — R1319 Other dysphagia: Secondary | ICD-10-CM | POA: Diagnosis not present

## 2022-08-07 DIAGNOSIS — K5900 Constipation, unspecified: Secondary | ICD-10-CM

## 2022-08-07 DIAGNOSIS — Z800 Family history of malignant neoplasm of digestive organs: Secondary | ICD-10-CM

## 2022-08-07 MED ORDER — SODIUM CHLORIDE 0.9 % IV SOLN: 500.0000 mL | INTRAVENOUS | Status: DC

## 2022-08-07 MED ORDER — METOCLOPRAMIDE HCL 5 MG PO TABS: ORAL_TABLET | ORAL | 0 refills | Status: DC

## 2022-08-07 MED ORDER — PEG 3350-KCL-NA BICARB-NACL 420 G PO SOLR: 4000.0000 mL | Freq: Once | ORAL | 0 refills | Status: AC

## 2022-08-07 NOTE — Progress Notes (Signed)
1348 HR > 100 with esmolol 25 mg given IV, MD updated, vss 

## 2022-08-07 NOTE — Progress Notes (Signed)
History and Physical:  This patient presents for endoscopic testing for: Encounter Diagnoses  Name Primary?   Iron deficiency anemia, unspecified iron deficiency anemia type Yes   Constipation, unspecified constipation type    Gastroesophageal reflux disease, unspecified whether esophagitis present    Esophageal dysphagia    Family history of colon cancer     Clinical details in office consult note of 06/10/2022. Family history of colon cancer, chronic constipation, reflux symptoms with dysphagia.  Also iron deficiency anemia felt likely due to menorrhagia from uterine fibroids.  See telephone note of 07/01/2022 for details, but this patient's EGD and colonoscopy scheduled for that day was canceled because she had not read and follow preparation instructions. She did not take her morning dose of bowel preparation because she did not wish 2, and reports that she still has solid stool today.  Thus her planned colonoscopy will not be performed. Patient is otherwise without complaints or active issues today.   Past Medical History: Past Medical History:  Diagnosis Date   Anemia    Arthritis    knee   Blood transfusion without reported diagnosis    Fibroids    GERD (gastroesophageal reflux disease)    Hypertension      Past Surgical History: Past Surgical History:  Procedure Laterality Date   APPENDECTOMY     CYSTOSCOPY N/A 09/03/2020   Procedure: CYSTOSCOPY;  Surgeon: Cheri Fowler, MD;  Location: [facility];  Service: Gynecology;  Laterality: N/A;   HYSTERECTOMY ABDOMINAL WITH SALPINGECTOMY Bilateral 09/03/2020   Procedure: HYSTERECTOMY ABDOMINAL WITH SALPINGECTOMY;  Surgeon: Cheri Fowler, MD;  Location: [place];  Service: Gynecology;  Laterality: Bilateral;  TAP Block   KNEE ARTHROSCOPY Left 07/02/2021   Procedure: ARTHROSCOPY KNEE, LATERAL MENISECTOMY, CONDRAPLASTY, ASPIRATION OF BAKERS CYST;  Surgeon: Netta Cedars, MD;  Location: [facility] [place];  Service: Orthopedics;  Laterality: Left;     Allergies: Allergies  Allergen Reactions   Tramadol Hives, Rash and Itching    Outpatient Meds: Current Outpatient Medications  Medication Sig Dispense Refill   amLODipine (NORVASC) 10 MG tablet Take 1 tablet (10 mg total) by mouth daily. 90 tablet 1   famotidine (PEPCID) 20 MG tablet Take 1 tablet (20 mg total) by mouth at bedtime. 30 tablet 2   hydrochlorothiazide (HYDRODIURIL) 25 MG tablet Take 1 tablet (25 mg total) by mouth daily. 90 tablet 3   acidophilus (RISAQUAD) CAPS capsule Take 1 capsule by mouth daily. (Patient not taking: Reported on 06/10/2022)     ciprofloxacin (CILOXAN) 0.3 % ophthalmic ointment Place into the right eye 2 (two) times daily. (Patient not taking: Reported on 06/10/2022) 3.5 g 0   diclofenac Sodium (VOLTAREN) 1 % GEL Apply 2 g topically 2 (two) times daily as needed (pain). (Patient not taking: Reported on 06/10/2022)     fluticasone (FLONASE) 50 MCG/ACT nasal spray Place 2 sprays into both nostrils daily. (Patient not taking: Reported on 06/10/2022) 16 g 6   loratadine (CLARITIN) 10 MG tablet Take 1 tablet (10 mg total) by mouth daily. (Patient not taking: Reported on 06/10/2022) 30 tablet 11   meloxicam (MOBIC) 15 MG tablet Take 1 tablet by mouth daily.     methocarbamol (ROBAXIN) 500 MG tablet Take 1 tablet (500 mg total) by mouth every 6 (six) hours as needed for muscle spasms. (Patient not taking: Reported on 06/10/2022) 60 tablet 1   pantoprazole (PROTONIX) 40 MG tablet Take 1 tablet (40 mg total) by mouth daily. (Patient not taking: Reported on 08/07/2022) 30 tablet 3  Current Facility-Administered Medications  Medication Dose Route Frequency Provider Last Rate Last Admin   0.9 %  sodium chloride infusion  500 mL Intravenous Continuous Nelida Meuse III, MD          ___________________________________________________________________ Objective   Exam:  BP 120/66   Pulse 85   Temp (!) 97.5 F (36.4 C)   Ht '5\' 4"'$  (1.626 m)   Wt 219 lb (99.3  kg)   LMP 08/29/2020   SpO2 100%   BMI 37.59 kg/m   CV: RRR without murmur, S1/S2 Resp: clear to auscultation bilaterally, normal RR and effort noted GI: soft, no tenderness, with active bowel sounds.   Assessment: Encounter Diagnoses  Name Primary?   Iron deficiency anemia, unspecified iron deficiency anemia type Yes   Constipation, unspecified constipation type    Gastroesophageal reflux disease, unspecified whether esophagitis present    Esophageal dysphagia    Family history of colon cancer      Plan:  EGD with possible dilation  If patient wishes to do so, AND particularly if she intends to take her bowel preparation as recommended, the colonoscopy will be rescheduled.(One last chance)  The patient is appropriate for an endoscopic procedure in the ambulatory setting.   - Wilfrid Lund, MD

## 2022-08-07 NOTE — Progress Notes (Signed)
1340 Robinul 0.1 mg IV given due large amount of secretions upon assessment.  MD made aware, vss 

## 2022-08-07 NOTE — Patient Instructions (Signed)
Colonoscopy rescheduled for August 25 at 1:30.    YOU HAD AN ENDOSCOPIC PROCEDURE TODAY AT [facility]:   Refer to the procedure report that was given to you for any specific questions about what was found during the examination.  If the procedure report does not answer your questions, please call your gastroenterologist to clarify.  If you requested that your care partner not be given the details of your procedure findings, then the procedure report has been included in a sealed envelope for you to review at your convenience later.  YOU SHOULD EXPECT: Some feelings of bloating in the abdomen. Passage of more gas than usual.  Walking can help get rid of the air that was put into your GI tract during the procedure and reduce the bloating. If you had a lower endoscopy (such as a colonoscopy or flexible sigmoidoscopy) you may notice spotting of blood in your stool or on the toilet paper. If you underwent a bowel prep for your procedure, you may not have a normal bowel movement for a few days.  Please Note:  You might notice some irritation and congestion in your nose or some drainage.  This is from the oxygen used during your procedure.  There is no need for concern and it should clear up in a day or so.  SYMPTOMS TO REPORT IMMEDIATELY:   Following upper endoscopy (EGD)  Vomiting of blood or coffee ground material  New chest pain or pain under the shoulder blades  Painful or persistently difficult swallowing  New shortness of breath  Fever of 100F or higher  Black, tarry-looking stools  For urgent or emergent issues, a gastroenterologist can be reached at any hour by calling [phone]. Do not use MyChart messaging for urgent concerns.    DIET:  We do recommend a small meal at first, but then you may proceed to your regular diet.  Drink plenty of fluids but you should avoid alcoholic beverages for 24 hours.  ACTIVITY:  You should plan to take it easy for the rest of  today and you should NOT DRIVE or use heavy machinery until tomorrow (because of the sedation medicines used during the test).    FOLLOW UP: Our staff will call the number listed on your records the next business day following your procedure.  We will call around 7:15- 8:00 am to check on you and address any questions or concerns that you may have regarding the information given to you following your procedure. If we do not reach you, we will leave a message.  If you develop any symptoms (ie: fever, flu-like symptoms, shortness of breath, cough etc.) before then, please call [phone].  If you test positive for Covid 19 in the 2 weeks post procedure, please call and report this information to [place].    If any biopsies were taken you will be contacted by phone or by letter within the next 1-3 weeks.  Please call us at [phone] if you have not heard about the biopsies in 3 weeks.    SIGNATURES/CONFIDENTIALITY: You and/or your care partner have signed paperwork which will be entered into your electronic medical record.  These signatures attest to the fact that that the information above on your After Visit Summary has been reviewed and is understood.  Full responsibility of the confidentiality of this discharge information lies with you and/or your care-partner.

## 2022-08-07 NOTE — Progress Notes (Signed)
Pt's states no medical or surgical changes since previsit or office visit. 

## 2022-08-07 NOTE — Progress Notes (Signed)
Report given to PACU, vss 

## 2022-08-07 NOTE — Op Note (Signed)
Stefanie Moon Patient Name: Stefanie Moon Procedure Date: 08/07/2022 1:38 PM MRN: [id number] Endoscopist: Mallie Mussel L. Loletha Carrow , MD Age: 51 Referring MD:  Date of Birth: 12/19/1971 Gender: Female Account #: [id number] Procedure:                Upper GI endoscopy Indications:              Esophageal dysphagia, Esophageal reflux symptoms                            that persist despite appropriate therapy Medicines:                Monitored Anesthesia Care Procedure:                Pre-Anesthesia Assessment:                           - Prior to the procedure, a History and Physical                            was performed, and patient medications and                            allergies were reviewed. The patient's tolerance of                            previous anesthesia was also reviewed. The risks                            and benefits of the procedure and the sedation                            options and risks were discussed with the patient.                            All questions were answered, and informed consent                            was obtained. Prior Anticoagulants: The patient has                            taken no previous anticoagulant or antiplatelet                            agents. ASA Grade Assessment: II - A patient with                            mild systemic disease. After reviewing the risks                            and benefits, the patient was deemed in                            satisfactory condition to undergo the procedure.  After obtaining informed consent, the endoscope was                            passed under direct vision. Throughout the                            procedure, the patient's blood pressure, pulse, and                            oxygen saturations were monitored continuously. The                            GIF [id number] #[id number] was introduced through the                            mouth, and  advanced to the second part of duodenum.                            The upper GI endoscopy was accomplished without                            difficulty. The patient tolerated the procedure                            well. Scope In: Scope Out: Findings:                 The lumen of the upper third of the esophagus and                            middle third of the esophagus appeared to have mild                            dilatation and diminished motility. Several passes                            made through the EGJ, sometimes with mild                            resistance.                           There is no endoscopic evidence of Barrett's                            esophagus, esophagitis or stricture in the entire                            esophagus.                           The stomach was normal.                           The cardia and gastric fundus were normal on  retroflexion.                           The examined duodenum was normal. Complications:            No immediate complications. Estimated Blood Loss:     Estimated blood loss: none. Impression:               - Dilation in the upper third of the esophagus and                            in the middle third of the esophagus.                           - Normal stomach.                           - Normal examined duodenum.                           - No specimens collected. Recommendation:           - Patient has a contact number available for                            emergencies. The signs and symptoms of potential                            delayed complications were discussed with the                            patient. Return to normal activities tomorrow.                            Written discharge instructions were provided to the                            patient.                           - Resume previous diet.                           - Continue present medications.                            - Schedule barium esophagram to assess for evidence                            of an esophageal motility disorder.                           Reschedule colonoscopy with split-dose golytely and                            one metoclopramide 5 mg tablet 30 minutes before  evening and AM prep doses. (Dispense 2 tablets,                            zero refill) Aniela Caniglia L. Loletha Carrow, MD 08/07/2022 2:00:57 PM This report has been signed electronically.

## 2022-08-10 ENCOUNTER — Telehealth: Payer: Self-pay

## 2022-08-10 DIAGNOSIS — R1319 Other dysphagia: Secondary | ICD-10-CM

## 2022-08-10 NOTE — Telephone Encounter (Signed)
Follow up call to pt, no answer. 

## 2022-08-10 NOTE — Telephone Encounter (Signed)
Per 08/07/22 procedure report - Schedule barium esophagram to assess for evidence of an esophageal motility disorder  Barium swallow study order in epic. Secure staff message sent to radiology scheduling to contact patient to set up appt.  Called and spoke with patient. She is aware that we have placed the order for barium swallow study. She knows to expect a call directly from radiology scheduling to set up her appt. Pt verbalized understanding and had no concerns at the end of the call.

## 2022-08-13 ENCOUNTER — Ambulatory Visit (INDEPENDENT_AMBULATORY_CARE_PROVIDER_SITE_OTHER): Payer: 59 | Admitting: Primary Care

## 2022-08-21 ENCOUNTER — Encounter: Payer: 59 | Admitting: Gastroenterology

## 2022-09-06 ENCOUNTER — Encounter: Payer: Self-pay | Admitting: Certified Registered Nurse Anesthetist

## 2022-09-10 ENCOUNTER — Encounter: Payer: Self-pay | Admitting: Gastroenterology

## 2022-09-10 ENCOUNTER — Telehealth: Payer: Self-pay | Admitting: Gastroenterology

## 2022-09-10 NOTE — Telephone Encounter (Signed)
Good Afternoon Dr. Loletha Carrow,  I spoke to patient yesterday and she stated she did not get her insurance information yet.  I explained if she wanted to have procedure done we list her as self pay and when she received the bill she could have billing resubmit with her insurace.  She agreed and stated she would be here for procedure.   Today I called at  12:45pm and left a message for her to call us if she was going to be late or wanted to reschedule.   I will NO SHOW her. Self Pay

## 2022-09-10 NOTE — Telephone Encounter (Signed)
Thank you for the note.  If the insurance issue is worked out, she can be rescheduled for her EGD and colonoscopy.  - HD

## 2023-01-15 ENCOUNTER — Other Ambulatory Visit: Payer: Self-pay

## 2023-01-15 ENCOUNTER — Encounter (HOSPITAL_COMMUNITY): Payer: Self-pay | Admitting: Emergency Medicine

## 2023-01-15 ENCOUNTER — Emergency Department (HOSPITAL_COMMUNITY)
Admission: EM | Admit: 2023-01-15 | Discharge: 2023-01-15 | Disposition: A | Discharge status: Home / Self Care | Payer: 59 | Attending: Emergency Medicine | Admitting: Emergency Medicine

## 2023-01-15 ENCOUNTER — Emergency Department (HOSPITAL_COMMUNITY): Payer: 59

## 2023-01-15 DIAGNOSIS — K8020 Calculus of gallbladder without cholecystitis without obstruction: Secondary | ICD-10-CM | POA: Insufficient documentation

## 2023-01-15 DIAGNOSIS — R195 Other fecal abnormalities: Secondary | ICD-10-CM | POA: Diagnosis not present

## 2023-01-15 DIAGNOSIS — K439 Ventral hernia without obstruction or gangrene: Secondary | ICD-10-CM | POA: Diagnosis not present

## 2023-01-15 DIAGNOSIS — K644 Residual hemorrhoidal skin tags: Secondary | ICD-10-CM | POA: Diagnosis not present

## 2023-01-15 DIAGNOSIS — R109 Unspecified abdominal pain: Secondary | ICD-10-CM | POA: Diagnosis not present

## 2023-01-15 DIAGNOSIS — K5790 Diverticulosis of intestine, part unspecified, without perforation or abscess without bleeding: Secondary | ICD-10-CM | POA: Diagnosis not present

## 2023-01-15 DIAGNOSIS — K5732 Diverticulitis of large intestine without perforation or abscess without bleeding: Secondary | ICD-10-CM | POA: Diagnosis not present

## 2023-01-15 DIAGNOSIS — K5730 Diverticulosis of large intestine without perforation or abscess without bleeding: Secondary | ICD-10-CM | POA: Diagnosis not present

## 2023-01-15 DIAGNOSIS — K649 Unspecified hemorrhoids: Secondary | ICD-10-CM

## 2023-01-15 LAB — COMPREHENSIVE METABOLIC PANEL
ALT: 16 U/L (ref 0–44)
AST: 19 U/L (ref 15–41)
Albumin: 3.7 g/dL (ref 3.5–5.0)
Alkaline Phosphatase: 56 U/L (ref 38–126)
Anion gap: 8 (ref 5–15)
BUN: 11 mg/dL (ref 6–20)
CO2: 25 mmol/L (ref 22–32)
Calcium: 8.7 mg/dL — ABNORMAL LOW (ref 8.9–10.3)
Chloride: 103 mmol/L (ref 98–111)
Creatinine, Ser: 1.03 mg/dL — ABNORMAL HIGH (ref 0.44–1.00)
GFR, Estimated: >60 mL/min (ref >60)
Glucose, Bld: 105 mg/dL — ABNORMAL HIGH (ref 70–99)
Potassium: 3.5 mmol/L (ref 3.5–5.1)
Sodium: 136 mmol/L (ref 135–145)
Total Bilirubin: 0.6 mg/dL (ref 0.3–1.2)
Total Protein: 7.4 g/dL (ref 6.5–8.1)

## 2023-01-15 LAB — URINALYSIS, ROUTINE W REFLEX MICROSCOPIC
Bacteria, UA: NONE SEEN
Bilirubin Urine: NEGATIVE
Glucose, UA: NEGATIVE mg/dL
Hgb urine dipstick: NEGATIVE
Ketones, ur: NEGATIVE mg/dL
Leukocytes,Ua: NEGATIVE
Nitrite: NEGATIVE
Protein, ur: 30 mg/dL — AB
Specific Gravity, Urine: 1.027 (ref 1.005–1.030)
pH: 5 (ref 5.0–8.0)

## 2023-01-15 LAB — CBC WITH DIFFERENTIAL/PLATELET
Abs Immature Granulocytes: 0.03 10*3/uL (ref 0.00–0.07)
Basophils Absolute: 0 10*3/uL (ref 0.0–0.1)
Basophils Relative: 0 %
Eosinophils Absolute: 0.4 10*3/uL (ref 0.0–0.5)
Eosinophils Relative: 4 %
HCT: 42.2 % (ref 36.0–46.0)
Hemoglobin: 13.8 g/dL (ref 12.0–15.0)
Immature Granulocytes: 0 %
Lymphocytes Relative: 22 %
Lymphs Abs: 2 10*3/uL (ref 0.7–4.0)
MCH: 29.2 pg (ref 26.0–34.0)
MCHC: 32.7 g/dL (ref 30.0–36.0)
MCV: 89.2 fL (ref 80.0–100.0)
Monocytes Absolute: 0.6 10*3/uL (ref 0.1–1.0)
Monocytes Relative: 7 %
Neutro Abs: 6.3 10*3/uL (ref 1.7–7.7)
Neutrophils Relative %: 67 %
Platelets: 222 10*3/uL (ref 150–400)
RBC: 4.73 MIL/uL (ref 3.87–5.11)
RDW: 13 % (ref 11.5–15.5)
WBC: 9.3 10*3/uL (ref 4.0–10.5)
nRBC: 0 % (ref 0.0–0.2)

## 2023-01-15 LAB — POC OCCULT BLOOD, ED: Fecal Occult Bld: POSITIVE — AB

## 2023-01-15 LAB — LIPASE, BLOOD: Lipase: 34 U/L (ref 11–51)

## 2023-01-15 MED ORDER — ONDANSETRON HCL 4 MG/2ML IJ SOLN
4.0000 mg | Freq: Once | INTRAMUSCULAR | Status: AC
  Administered 2023-01-15: 4 mg via INTRAVENOUS
  Filled 2023-01-15: qty 2

## 2023-01-15 MED ORDER — MORPHINE SULFATE (PF) 4 MG/ML IV SOLN
4.0000 mg | Freq: Once | INTRAVENOUS | Status: AC
  Administered 2023-01-15: 4 mg via INTRAVENOUS
  Filled 2023-01-15: qty 1

## 2023-01-15 MED ORDER — NAPROXEN 375 MG PO TABS: 375.0000 mg | ORAL_TABLET | Freq: Two times a day (BID) | ORAL | 0 refills | Status: AC

## 2023-01-15 MED ORDER — KETOROLAC TROMETHAMINE 15 MG/ML IJ SOLN
15.0000 mg | Freq: Once | INTRAMUSCULAR | Status: AC
  Administered 2023-01-15: 15 mg via INTRAVENOUS
  Filled 2023-01-15: qty 1

## 2023-01-15 MED ORDER — IOHEXOL 300 MG/ML  SOLN
100.0000 mL | Freq: Once | INTRAMUSCULAR | Status: AC | PRN
  Administered 2023-01-15: 100 mL via INTRAVENOUS

## 2023-01-15 NOTE — Discharge Instructions (Addendum)
Take the medications to help with pain and discomfort.  Follow-up with a surgery and GI doctor as we discussed.  Return to the ER for worsening symptoms including fever, severe pain, increased bleeding

## 2023-01-15 NOTE — ED Provider Notes (Signed)
[place] EMERGENCY DEPARTMENT AT [facility] Provider Note   CSN: [id number] Arrival date & time: 01/15/23  9735     History  Chief complaint: Rectal bleeding abdominal pain  Stefanie Moon is a 52 y.o. female.  HPI   Pt has been having trouble with constipation for several months.  She has been treating herself with medications for constipation.  Pt has been constipated again the last couple of days.  She noticed blood in her stool the last day or two.  She did feel a hemorrhoid as well.  SHe has been feeling bloated and having abdominal pain.  No fevers.  No vomiting, some nausea.  Patient also concerned about the possibility of colon cancer.  Family members have been diagnosed with  Home Medications Prior to Admission medications   Medication Sig Start Date End Date Taking? Authorizing Provider  naproxen (NAPROSYN) 375 MG tablet Take 1 tablet (375 mg total) by mouth 2 (two) times daily. 01/15/23  Yes Dorie Rank, MD  acidophilus (RISAQUAD) CAPS capsule Take 1 capsule by mouth daily. Patient not taking: Reported on 06/10/2022    [provider]  amLODipine (NORVASC) 10 MG tablet Take 1 tablet (10 mg total) by mouth daily. 03/31/22   Kerin Perna, NP  ciprofloxacin (CILOXAN) 0.3 % ophthalmic ointment Place into the right eye 2 (two) times daily. Patient not taking: Reported on 06/10/2022 05/13/22   Kerin Perna, NP  famotidine (PEPCID) 20 MG tablet Take 1 tablet (20 mg total) by mouth at bedtime. 06/10/22   Noralyn Pick, NP  fluticasone (FLONASE) 50 MCG/ACT nasal spray Place 2 sprays into both nostrils daily. Patient not taking: Reported on 06/10/2022 05/13/22   Kerin Perna, NP  hydrochlorothiazide (HYDRODIURIL) 25 MG tablet Take 1 tablet (25 mg total) by mouth daily. 03/31/22   Kerin Perna, NP  loratadine (CLARITIN) 10 MG tablet Take 1 tablet (10 mg total) by mouth daily. Patient not taking: Reported on 06/10/2022 05/13/22    Kerin Perna, NP  meloxicam (MOBIC) 15 MG tablet Take 1 tablet by mouth daily.    [provider]  methocarbamol (ROBAXIN) 500 MG tablet Take 1 tablet (500 mg total) by mouth every 6 (six) hours as needed for muscle spasms. Patient not taking: Reported on 06/10/2022 07/02/21   Netta Cedars, MD  metoCLOPramide (REGLAN) 5 MG tablet Take one 5 mg tablet 30 minutes before evening and morning colon prep doses 08/07/22   Doran Stabler, MD  pantoprazole (PROTONIX) 40 MG tablet Take 1 tablet (40 mg total) by mouth daily. Patient not taking: Reported on 08/07/2022 03/31/22   Kerin Perna, NP  Esomeprazole Magnesium (NEXIUM PO) Take by mouth daily.  02/01/21  [provider]  lisinopril (ZESTRIL) 20 MG tablet Take 20 mg by mouth daily.  02/01/21  [provider]      Allergies    Tramadol    Review of Systems   Review of Systems  Physical Exam Updated Vital Signs BP (!) 140/94   Pulse 68   Temp 98.3 F (36.8 C) (Oral)   Resp 18   Ht 1.626 m ('5\' 4"'$ )   Wt 99.8 kg   LMP 08/29/2020   SpO2 99%   BMI 37.76 kg/m  Physical Exam Vitals and nursing note reviewed.  Constitutional:      General: She is not in acute distress.    Appearance: She is well-developed.  HENT:     Head: Normocephalic and  atraumatic.     Right Ear: External ear normal.     Left Ear: External ear normal.  Eyes:     General: No scleral icterus.       Right eye: No discharge.        Left eye: No discharge.     Conjunctiva/sclera: Conjunctivae normal.  Neck:     Trachea: No tracheal deviation.  Cardiovascular:     Rate and Rhythm: Normal rate and regular rhythm.  Pulmonary:     Effort: Pulmonary effort is normal. No respiratory distress.     Breath sounds: Normal breath sounds. No stridor. No wheezing or rales.  Abdominal:     General: Bowel sounds are normal. There is no distension.     Palpations: Abdomen is soft.     Tenderness: There is abdominal tenderness. There is no  guarding or rebound.     Comments: Tenderness palpation left lower quadrant right lower quadrant suprapubic  Genitourinary:    Comments: No gross blood noted on rectal exam no fecal impaction Musculoskeletal:        General: No tenderness or deformity.     Cervical back: Neck supple.  Skin:    General: Skin is warm and dry.     Findings: No rash.  Neurological:     General: No focal deficit present.     Mental Status: She is alert.     Cranial Nerves: No cranial nerve deficit, dysarthria or facial asymmetry.     Sensory: No sensory deficit.     Motor: No abnormal muscle tone or seizure activity.     Coordination: Coordination normal.  Psychiatric:        Mood and Affect: Mood normal.     ED Results / Procedures / Treatments   Labs (all labs ordered are listed, but only abnormal results are displayed) Labs Reviewed  COMPREHENSIVE METABOLIC PANEL - Abnormal; Notable for the following components:      Result Value   Glucose, Bld 105 (*)    Creatinine, Ser 1.03 (*)    Calcium 8.7 (*)    All other components within normal limits  URINALYSIS, ROUTINE W REFLEX MICROSCOPIC - Abnormal; Notable for the following components:   APPearance HAZY (*)    Protein, ur 30 (*)    All other components within normal limits  POC OCCULT BLOOD, ED - Abnormal; Notable for the following components:   Fecal Occult Bld POSITIVE (*)    All other components within normal limits  LIPASE, BLOOD  CBC WITH DIFFERENTIAL/PLATELET    EKG None  Radiology CT ABDOMEN PELVIS W CONTRAST  Result Date: 01/15/2023 CLINICAL DATA:  Evaluate for diverticulitis.  Lower abdominal pain. EXAM: CT ABDOMEN AND PELVIS WITH CONTRAST TECHNIQUE: Multidetector CT imaging of the abdomen and pelvis was performed using the standard protocol following bolus administration of intravenous contrast. RADIATION DOSE REDUCTION: This exam was performed according to the departmental dose-optimization program which includes automated  exposure control, adjustment of the mA and/or kV according to patient size and/or use of iterative reconstruction technique. CONTRAST:  158m OMNIPAQUE IOHEXOL 300 MG/ML  SOLN COMPARISON:  CT abdomen pelvis 09/01/2018 FINDINGS: Lower chest: Normal heart size. Dependent atelectasis within the bilateral lower lobes. Stable 2 mm left lower lobe nodule (image 7; series 6), compatible with benign process. No imaging follow-up needed. Hepatobiliary: Liver is normal in size and contour. No focal hepatic lesion is identified. Multiple stones in the gallbladder lumen. Additionally there appears to be a stone within the gallbladder  neck. No pericholecystic fat stranding. Common bile duct is normal in appearance. Pancreas: Unremarkable Spleen: Unremarkable Adrenals/Urinary Tract: Normal adrenal glands bilaterally. Kidneys enhance symmetrically with contrast. No hydronephrosis. Urinary bladder is unremarkable. Stomach/Bowel: Descending and sigmoid colonic diverticulosis. No CT evidence for acute diverticulitis. No evidence for small bowel obstruction. No free fluid or free intraperitoneal air. Vascular/Lymphatic: Normal caliber abdominal aorta. No retroperitoneal lymphadenopathy. Reproductive: Prior hysterectomy. Adnexal structures are unremarkable. Other: Complex fat containing ventral abdominal wall hernia with mild stranding. Musculoskeletal: Lumbar spine degenerative changes. No aggressive or acute appearing osseous lesions. IMPRESSION: 1. Descending and sigmoid colonic diverticulosis without CT evidence for acute diverticulitis. 2. Complex fat containing ventral abdominal wall hernia with mild stranding. 3. Cholelithiasis.  Probable stone in the gallbladder neck. Electronically Signed   By: Lovey Newcomer M.D.   On: 01/15/2023 11:59    Procedures Procedures    Medications Ordered in ED Medications  ketorolac (TORADOL) 15 MG/ML injection 15 mg (15 mg Intravenous Given 01/15/23 1053)  ondansetron (ZOFRAN) injection 4 mg  (4 mg Intravenous Given 01/15/23 1053)  iohexol (OMNIPAQUE) 300 MG/ML solution 100 mL (100 mLs Intravenous Contrast Given 01/15/23 1121)  morphine (PF) 4 MG/ML injection 4 mg (4 mg Intravenous Given 01/15/23 1217)    ED Course/ Medical Decision Making/ A&P Clinical Course as of 01/15/23 1237  Fri Jan 15, 2023  1206 CT scan shows evidence of colonic diverticulosis without evidence of diverticulitis.  Patient has a ventral abdominal wall hernia with mild fat stranding.  No evidence of obstruction.  Patient also has cholelithiasis without evidence of cholecystitis [JK]  1234 Laboratory test shows normal hemoglobin.  Metabolic panel LFTs lipase unremarkable.  Patient is occult blood positive and that her stool [JK]    Clinical Course User Index [JK] Dorie Rank, MD                             Medical Decision Making Differential diagnosis includes but not to rectal bleeding associated with hemorrhoids and constipation.  Colonic mass, diverticulitis colitis also a concern  Problems Addressed: Diverticulosis: chronic illness or injury Gallstones: chronic illness or injury Hemorrhoids, unspecified hemorrhoid type: acute illness or injury Occult blood positive stool: acute illness or injury Ventral hernia without obstruction or gangrene: undiagnosed new problem with uncertain prognosis  Amount and/or Complexity of Data Reviewed Labs: ordered. Decision-making details documented in ED Course. Radiology: ordered and independent interpretation performed.  Risk Prescription drug management.   Patient presented with complaints of abdominal pain as well as blood in her stool.  No signs of severe GI bleeding.  Hemoglobin is normal.  There is no gross blood noted on rectal exam.  No evidence of melena.  Patient was Hemoccult positive however I do see hemorrhoids on exam.  It is possible this may be the source of her rectal bleeding.  Patient was also complaining of abdominal pain but there is no  evidence of diverticulitis, colitis or mass on CT scan.  Patient does have a ventral hernia as well as gallstones.  I do believe it is possible her hernia may be causing some of her abdominal pain.  Fortunately no signs of upper GYN or bowel within the hernia.  She does have gallstones but is not having any pain in her right upper quadrant I do not believe that is acutely causing her symptoms today.  Patient appears stable for outpatient follow-up.  Will have her see GI to discuss possible colonoscopy to  evaluate her rectal bleeding.  Also will have her see general surgery regarding her gallstones and hernia.  Evaluation and diagnostic testing in the emergency department does not suggest an emergent condition requiring admission or immediate intervention beyond what has been performed at this time.  The patient is safe for discharge and has been instructed to return immediately for worsening symptoms, change in symptoms or any other concerns.        Final Clinical Impression(s) / ED Diagnoses Final diagnoses:  Occult blood positive stool  Ventral hernia without obstruction or gangrene  Diverticulosis  Gallstones  Hemorrhoids, unspecified hemorrhoid type    Rx / DC Orders ED Discharge Orders          Ordered    naproxen (NAPROSYN) 375 MG tablet  2 times daily        01/15/23 1233              Dorie Rank, MD 01/15/23 1237

## 2023-01-15 NOTE — ED Triage Notes (Signed)
Pt here from home with c/o constipation , took otc meds and had relief from the constipation but noticed some blood in stool , does have hemorrhoids

## 2023-01-19 ENCOUNTER — Telehealth: Payer: Self-pay

## 2023-01-19 NOTE — Telephone Encounter (Signed)
Lm on vm for patient to return call to discuss getting rescheduled for her colonoscopy.

## 2023-01-19 NOTE — Telephone Encounter (Signed)
-----  Message from Doran Stabler, MD sent at 01/19/2023  6:02 AM EST ----- This is a patient of mine seen in the office last year for constipation anemia and rectal bleeding. Has been noncompliant with bowel preparation on 2 occasions.  Was rescheduled for colonoscopy September 2023 and canceled for reported insurance reasons.  Please contact this patient and inquire if she plans to reschedule her colonoscopy.  If so, I am offering her one last chance to do so with the bowel prep instructions outlined in my August 2023 upper endoscopy report.  - HD

## 2023-01-22 NOTE — Telephone Encounter (Signed)
Called and spoke with patient regarding rescheduling her colonoscopy. Pt states that she would like to go ahead and reschedule because she continues to have rectal bleeding. Pt has been scheduled for a PV on Friday, 01/29/23 at 3:30 pm. Colonoscopy is scheduled in the [facility] on Thursday, 02/11/23 at 3:00 pm. Patient is aware that she will need to arrive at 2 pm with a care partner. Pt is aware that I will send her appt information via MyChart. Pt verbalized understanding and had no concerns at the end of the call.

## 2023-01-29 ENCOUNTER — Ambulatory Visit (AMBULATORY_SURGERY_CENTER): Payer: 59

## 2023-01-29 VITALS — Ht 64.0 in | Wt 210.0 lb

## 2023-01-29 DIAGNOSIS — K625 Hemorrhage of anus and rectum: Secondary | ICD-10-CM

## 2023-01-29 DIAGNOSIS — D649 Anemia, unspecified: Secondary | ICD-10-CM

## 2023-01-29 MED ORDER — NA SULFATE-K SULFATE-MG SULF 17.5-3.13-1.6 GM/177ML PO SOLN: 1.0000 | Freq: Once | ORAL | 0 refills | Status: DC

## 2023-01-29 MED ORDER — PEG 3350-KCL-NA BICARB-NACL 420 G PO SOLR: 4000.0000 mL | Freq: Once | ORAL | 0 refills | Status: AC

## 2023-01-29 MED ORDER — ONDANSETRON HCL 4 MG PO TABS: 4.0000 mg | ORAL_TABLET | ORAL | 0 refills | Status: AC

## 2023-01-29 NOTE — Progress Notes (Signed)

## 2023-02-01 ENCOUNTER — Encounter: Payer: Self-pay | Admitting: Gastroenterology

## 2023-02-11 ENCOUNTER — Ambulatory Visit (AMBULATORY_SURGERY_CENTER): Payer: 59 | Admitting: Gastroenterology

## 2023-02-11 ENCOUNTER — Encounter: Payer: Self-pay | Admitting: Gastroenterology

## 2023-02-11 VITALS — BP 138/89 | HR 95 | Temp 97.3°F | Resp 19 | Ht 64.0 in | Wt 210.0 lb

## 2023-02-11 DIAGNOSIS — D125 Benign neoplasm of sigmoid colon: Secondary | ICD-10-CM | POA: Diagnosis not present

## 2023-02-11 DIAGNOSIS — K5909 Other constipation: Secondary | ICD-10-CM

## 2023-02-11 DIAGNOSIS — K625 Hemorrhage of anus and rectum: Secondary | ICD-10-CM | POA: Diagnosis not present

## 2023-02-11 DIAGNOSIS — D123 Benign neoplasm of transverse colon: Secondary | ICD-10-CM | POA: Diagnosis not present

## 2023-02-11 DIAGNOSIS — I10 Essential (primary) hypertension: Secondary | ICD-10-CM | POA: Diagnosis not present

## 2023-02-11 MED ORDER — SODIUM CHLORIDE 0.9 % IV SOLN: 500.0000 mL | INTRAVENOUS | Status: DC

## 2023-02-11 NOTE — Progress Notes (Signed)
Called to room to assist during endoscopic procedure.  Patient ID and intended procedure confirmed with present staff. Received instructions for my participation in the procedure from the performing physician.  

## 2023-02-11 NOTE — Op Note (Signed)
[facility] Patient Name: Stefanie Moon Procedure Date: 02/11/2023 2:34 PM MRN: [id number] Endoscopist: [place]. Loletha Carrow , MD, [id number] Age: 52 Referring MD:  Date of Birth: Oct 09, 1971 Gender: Female Account #: [id number] Procedure:                Colonoscopy Indications:              Rectal bleeding, Constipation                           Patient also has a history of colon cancer in her                            father. This is her first colonoscopy.                           Recent episode of perianal swelling and question of                            hemorrhoid. Medicines:                Monitored Anesthesia Care Procedure:                Pre-Anesthesia Assessment:                           - Prior to the procedure, a History and Physical                            was performed, and patient medications and                            allergies were reviewed. The patient's tolerance of                            previous anesthesia was also reviewed. The risks                            and benefits of the procedure and the sedation                            options and risks were discussed with the patient.                            All questions were answered, and informed consent                            was obtained. Prior Anticoagulants: The patient has                            taken no anticoagulant or antiplatelet agents. ASA                            Grade Assessment: III - A patient with severe  systemic disease. After reviewing the risks and                            benefits, the patient was deemed in satisfactory                            condition to undergo the procedure.                           After obtaining informed consent, the colonoscope                            was passed under direct vision. Throughout the                            procedure, the patient's blood pressure, pulse, and                             oxygen saturations were monitored continuously. The                            Olympus [id number] SN [id number] was introduced through                            the anus and advanced to the the cecum, identified                            by appendiceal orifice and ileocecal valve. The                            colonoscopy was performed without difficulty. The                            patient tolerated the procedure well. The quality                            of the bowel preparation was good. The ileocecal                            valve, appendiceal orifice, and rectum were                            photographed. Scope In: 2:53:40 PM Scope Out: 3:08:18 PM Scope Withdrawal Time: 0 hours 10 minutes 44 seconds  Total Procedure Duration: 0 hours 14 minutes 38 seconds  Findings:                 The perianal and digital rectal examinations were                            normal.                           Repeat examination of right colon under NBI  performed.                           An 8 mm polyp was found in the proximal transverse                            colon. The polyp was sessile. The polyp was removed                            with a cold snare. The polyp was removed with a                            piecemeal technique using a cold snare. Resection                            and retrieval were complete.                           An 8 mm polyp was found in the sigmoid colon. The                            polyp was pedunculated. The polyp was removed with                            a hot snare. Resection and retrieval were complete.                           The exam was otherwise without abnormality on                            direct and retroflexion views. (No internal                            hemorrhoids seen, and no anal fissure)                           Patient may have recently had external hemorrhoid                             swelling recently that has now subsided. No                            internal hemorrhoids were seen. Benign anal rectal                            bleeding related to constipation. Complications:            No immediate complications. Estimated Blood Loss:     Estimated blood loss was minimal. Impression:               - One 8 mm polyp in the proximal transverse colon,                            removed with a cold snare and removed  piecemeal                            using a cold snare. Resected and retrieved.                           - One 8 mm polyp in the sigmoid colon, removed with                            a hot snare. Resected and retrieved.                           - The examination was otherwise normal on direct                            and retroflexion views. Recommendation:           - Patient has a contact number available for                            emergencies. The signs and symptoms of potential                            delayed complications were discussed with the                            patient. Return to normal activities tomorrow.                            Written discharge instructions were provided to the                            patient.                           - Resume previous diet.                           - Continue present medications.                           - Await pathology results.                           - Repeat colonoscopy is recommended for                            surveillance and family history. The colonoscopy                            date will be determined after pathology results                            from today's exam become available for review. (Not  longer than 5 years due to family history of colon                            cancer)                           - Miralax 1 capful (17 grams) in 8 ounces of water                            PO daily.                           - Return to  physician assistant at appointment to                            be scheduled. Tkai Serfass L. Loletha Carrow, MD 02/11/2023 3:18:01 PM This report has been signed electronically.

## 2023-02-11 NOTE — Progress Notes (Signed)
Report to PACU, RN, vss, BBS= Clear.  

## 2023-02-11 NOTE — Progress Notes (Signed)
History and Physical:  This patient presents for endoscopic testing for: Encounter Diagnoses  Name Primary?   Rectal bleeding Yes   Chronic constipation     Clinical details in June 2023 office consult note.  Patient has chronic constipation and rectal bleeding.  She had iron deficiency in the past, but it has resolved on recent labs. History of colon cancer in her father as well.  Patient is otherwise without complaints or active issues today.   Past Medical History: Past Medical History:  Diagnosis Date   Anemia    Arthritis    knee   Blood transfusion without reported diagnosis    Fibroids    GERD (gastroesophageal reflux disease)    Hypertension      Past Surgical History: Past Surgical History:  Procedure Laterality Date   APPENDECTOMY     CYSTOSCOPY N/A 09/03/2020   Procedure: CYSTOSCOPY;  Surgeon: Cheri Fowler, MD;  Location: [facility];  Service: Gynecology;  Laterality: N/A;   HYSTERECTOMY ABDOMINAL WITH SALPINGECTOMY Bilateral 09/03/2020   Procedure: HYSTERECTOMY ABDOMINAL WITH SALPINGECTOMY;  Surgeon: Cheri Fowler, MD;  Location: [place];  Service: Gynecology;  Laterality: Bilateral;  TAP Block   KNEE ARTHROSCOPY Left 07/02/2021   Procedure: ARTHROSCOPY KNEE, LATERAL MENISECTOMY, CONDRAPLASTY, ASPIRATION OF BAKERS CYST;  Surgeon: Netta Cedars, MD;  Location: [place];  Service: Orthopedics;  Laterality: Left;    Allergies: Allergies  Allergen Reactions   Tramadol Hives, Rash and Itching    Outpatient Meds: Current Outpatient Medications  Medication Sig Dispense Refill   acidophilus (RISAQUAD) CAPS capsule Take 1 capsule by mouth daily.     amLODipine (NORVASC) 10 MG tablet Take 1 tablet (10 mg total) by mouth daily. 90 tablet 1   fluticasone (FLONASE) 50 MCG/ACT nasal spray Place 2 sprays into both nostrils daily. 16 g 6   ondansetron (ZOFRAN) 4 MG tablet Take 1 tablet (4 mg total) by mouth as directed. Take one Zofran 4 mg tablet 30-60 minutes before each  colonoscopy prep dose 2 tablet 0   ciprofloxacin (CILOXAN) 0.3 % ophthalmic ointment Place into the right eye 2 (two) times daily. (Patient not taking: Reported on 02/11/2023) 3.5 g 0   famotidine (PEPCID) 20 MG tablet Take 1 tablet (20 mg total) by mouth at bedtime. (Patient not taking: Reported on 01/29/2023) 30 tablet 2   naproxen (NAPROSYN) 375 MG tablet Take 1 tablet (375 mg total) by mouth 2 (two) times daily. (Patient not taking: Reported on 01/29/2023) 20 tablet 0   pantoprazole (PROTONIX) 40 MG tablet Take 1 tablet (40 mg total) by mouth daily. (Patient not taking: Reported on 08/07/2022) 30 tablet 3   Current Facility-Administered Medications  Medication Dose Route Frequency Provider Last Rate Last Admin   0.9 %  sodium chloride infusion  500 mL Intravenous Continuous Danis, Estill Cotta III, MD          ___________________________________________________________________ Objective   Exam:  BP 136/88   Pulse 83   Temp (!) 97.3 F (36.3 C)   Ht 5' 4"$  (1.626 m)   Wt 210 lb (95.3 kg)   LMP 08/29/2020   SpO2 97%   BMI 36.05 kg/m   CV: regular , S1/S2 Resp: clear to auscultation bilaterally, normal RR and effort noted GI: soft, mild lower abdominal tenderness, with active bowel sounds.   Assessment: Encounter Diagnoses  Name Primary?   Rectal bleeding Yes   Chronic constipation      Plan: Colonoscopy  The benefits and risks of the planned procedure were  described in detail with the patient or (when appropriate) their health care proxy.  Risks were outlined as including, but not limited to, bleeding, infection, perforation, adverse medication reaction leading to cardiac or pulmonary decompensation, pancreatitis (if ERCP).  The limitation of incomplete mucosal visualization was also discussed.  No guarantees or warranties were given.    The patient is appropriate for an endoscopic procedure in the ambulatory setting.   - Wilfrid Lund, MD

## 2023-02-11 NOTE — Patient Instructions (Addendum)
-  Handout on polyps provided -await pathology results -repeat colonoscopy for surveillance recommended. Date to be determined when pathology result become available   -Continue present medications  -MIRALAX 1 CAPFUL (17 GRAMS) IN 8 OUNCES OF WATER BY MOUTH DAILY  -return to physician assistant at appointment to be scheduled. Call to schedule.   YOU HAD AN ENDOSCOPIC PROCEDURE TODAY AT [facility]:   Refer to the procedure report that was given to you for any specific questions about what was found during the examination.  If the procedure report does not answer your questions, please call your gastroenterologist to clarify.  If you requested that your care partner not be given the details of your procedure findings, then the procedure report has been included in a sealed envelope for you to review at your convenience later.  YOU SHOULD EXPECT: Some feelings of bloating in the abdomen. Passage of more gas than usual.  Walking can help get rid of the air that was put into your GI tract during the procedure and reduce the bloating. If you had a lower endoscopy (such as a colonoscopy or flexible sigmoidoscopy) you may notice spotting of blood in your stool or on the toilet paper. If you underwent a bowel prep for your procedure, you may not have a normal bowel movement for a few days.  Please Note:  You might notice some irritation and congestion in your nose or some drainage.  This is from the oxygen used during your procedure.  There is no need for concern and it should clear up in a day or so.  SYMPTOMS TO REPORT IMMEDIATELY:  Following lower endoscopy (colonoscopy or flexible sigmoidoscopy):  Excessive amounts of blood in the stool  Significant tenderness or worsening of abdominal pains  Swelling of the abdomen that is new, acute  Fever of 100F or higher  For urgent or emergent issues, a gastroenterologist can be reached at any hour by calling [phone]. Do not use MyChart  messaging for urgent concerns.    DIET:  We do recommend a small meal at first, but then you may proceed to your regular diet.  Drink plenty of fluids but you should avoid alcoholic beverages for 24 hours.  ACTIVITY:  You should plan to take it easy for the rest of today and you should NOT DRIVE or use heavy machinery until tomorrow (because of the sedation medicines used during the test).    FOLLOW UP: Our staff will call the number listed on your records the next business day following your procedure.  We will call around 7:15- 8:00 am to check on you and address any questions or concerns that you may have regarding the information given to you following your procedure. If we do not reach you, we will leave a message.     If any biopsies were taken you will be contacted by phone or by letter within the next 1-3 weeks.  Please call us at [phone] if you have not heard about the biopsies in 3 weeks.    SIGNATURES/CONFIDENTIALITY: You and/or your care partner have signed paperwork which will be entered into your electronic medical record.  These signatures attest to the fact that that the information above on your After Visit Summary has been reviewed and is understood.  Full responsibility of the confidentiality of this discharge information lies with you and/or your care-partner.

## 2023-02-11 NOTE — Progress Notes (Signed)
Pt's states no medical or surgical changes since previsit or office visit. 

## 2023-02-12 ENCOUNTER — Telehealth: Payer: Self-pay

## 2023-02-12 ENCOUNTER — Telehealth: Payer: Self-pay | Admitting: *Deleted

## 2023-02-12 NOTE — Telephone Encounter (Signed)
  Follow up Call-     02/11/2023    2:00 PM 08/07/2022    1:02 PM  Call back number  Post procedure Call Back phone  # [phone]  Permission to leave phone message Yes Yes     Patient questions:  Do you have a fever, pain , or abdominal swelling? No. Pain Score  0 *  Have you tolerated food without any problems? Yes.    Have you been able to return to your normal activities? Yes.    Do you have any questions about your discharge instructions: Diet   No. Medications  No. Follow up visit  No.  Do you have questions or concerns about your Care? No.  Actions: * If pain score is 4 or above: No action needed, pain <4.

## 2023-02-12 NOTE — Telephone Encounter (Signed)
Per 02/11/23 procedure report - Return to PA at appt to be scheduled.  Patient has been scheduled for a follow up with Carl Best, NP on Tuesday, 03/16/23 at 3 pm. Appt information mailed and sent to patient via [place].

## 2023-02-17 ENCOUNTER — Encounter: Payer: Self-pay | Admitting: Gastroenterology

## 2023-03-16 ENCOUNTER — Ambulatory Visit: Payer: 59 | Admitting: Nurse Practitioner

## 2023-03-26 DIAGNOSIS — I10 Essential (primary) hypertension: Secondary | ICD-10-CM | POA: Diagnosis not present

## 2023-03-26 DIAGNOSIS — D509 Iron deficiency anemia, unspecified: Secondary | ICD-10-CM | POA: Diagnosis not present

## 2023-03-26 DIAGNOSIS — E559 Vitamin D deficiency, unspecified: Secondary | ICD-10-CM | POA: Diagnosis not present

## 2023-03-26 DIAGNOSIS — K219 Gastro-esophageal reflux disease without esophagitis: Secondary | ICD-10-CM | POA: Diagnosis not present

## 2023-05-31 DIAGNOSIS — M9906 Segmental and somatic dysfunction of lower extremity: Secondary | ICD-10-CM | POA: Diagnosis not present

## 2023-05-31 DIAGNOSIS — M9903 Segmental and somatic dysfunction of lumbar region: Secondary | ICD-10-CM | POA: Diagnosis not present

## 2023-05-31 DIAGNOSIS — M4316 Spondylolisthesis, lumbar region: Secondary | ICD-10-CM | POA: Diagnosis not present

## 2023-05-31 DIAGNOSIS — M1712 Unilateral primary osteoarthritis, left knee: Secondary | ICD-10-CM | POA: Diagnosis not present

## 2023-07-14 DIAGNOSIS — I10 Essential (primary) hypertension: Secondary | ICD-10-CM | POA: Diagnosis not present

## 2023-07-15 ENCOUNTER — Other Ambulatory Visit (HOSPITAL_COMMUNITY): Payer: Self-pay | Admitting: General Surgery

## 2023-07-15 DIAGNOSIS — K430 Incisional hernia with obstruction, without gangrene: Secondary | ICD-10-CM | POA: Diagnosis not present

## 2023-07-15 DIAGNOSIS — R1310 Dysphagia, unspecified: Secondary | ICD-10-CM

## 2023-07-15 DIAGNOSIS — K8020 Calculus of gallbladder without cholecystitis without obstruction: Secondary | ICD-10-CM | POA: Diagnosis not present

## 2023-07-15 DIAGNOSIS — K5900 Constipation, unspecified: Secondary | ICD-10-CM | POA: Diagnosis not present

## 2023-07-15 DIAGNOSIS — E669 Obesity, unspecified: Secondary | ICD-10-CM | POA: Diagnosis not present

## 2023-07-15 DIAGNOSIS — M6200 Separation of muscle (nontraumatic), unspecified site: Secondary | ICD-10-CM | POA: Diagnosis not present

## 2023-07-15 DIAGNOSIS — R12 Heartburn: Secondary | ICD-10-CM | POA: Diagnosis not present

## 2023-07-21 NOTE — Progress Notes (Signed)
Sent message, via epic in basket, requesting orders in epic from surgeon.  

## 2023-07-22 ENCOUNTER — Ambulatory Visit: Payer: Self-pay | Admitting: General Surgery

## 2023-07-22 DIAGNOSIS — K8020 Calculus of gallbladder without cholecystitis without obstruction: Secondary | ICD-10-CM

## 2023-07-23 ENCOUNTER — Ambulatory Visit (HOSPITAL_COMMUNITY)
Admission: RE | Admit: 2023-07-23 | Discharge: 2023-07-23 | Disposition: A | Discharge status: Home / Self Care | Payer: 59 | Source: Ambulatory Visit | Attending: General Surgery | Admitting: General Surgery

## 2023-07-23 DIAGNOSIS — R1110 Vomiting, unspecified: Secondary | ICD-10-CM | POA: Diagnosis not present

## 2023-07-23 DIAGNOSIS — R1310 Dysphagia, unspecified: Secondary | ICD-10-CM | POA: Diagnosis not present

## 2023-07-27 NOTE — Patient Instructions (Signed)
SURGICAL WAITING ROOM VISITATION Patients having surgery or a procedure may have no more than 2 support people in the waiting area - these visitors may rotate in the visitor waiting room.   Due to an increase in RSV and influenza rates and associated hospitalizations, children ages 59 and under may not visit patients in [facility] hospitals. If the patient needs to stay at the hospital during part of their recovery, the visitor guidelines for inpatient rooms apply.  PRE-OP VISITATION  Pre-op nurse will coordinate an appropriate time for 1 support person to accompany the patient in pre-op.  This support person may not rotate.  This visitor will be contacted when the time is appropriate for the visitor to come back in the pre-op area.  Please refer to the [facility] website for the visitor guidelines for Inpatients (after your surgery is over and you are in a regular room).  You are not required to quarantine at this time prior to your surgery. However, you must do this: Hand Hygiene often Do NOT share personal items Notify your provider if you are in close contact with someone who has COVID or you develop fever 100.4 or greater, new onset of sneezing, cough, sore throat, shortness of breath or body aches.  If you test positive for Covid or have been in contact with anyone that has tested positive in the last 10 days please notify you surgeon.    Your procedure is scheduled on:  Monday  August 09, 2023  Report to [facility] Main Entrance: [place] entrance where the [employer] is available.   Report to admitting at:  05:15   AM  Call this number if you have any questions or problems the morning of surgery [phone]  CLEAR LIQUID DIET THE DAY BEFORE YOUR SURGERY.   Do not eat food after Midnight the night prior to your surgery/procedure.  After Midnight you may have the following liquids until   04:30  AM  DAY OF SURGERY  Clear Liquid Diet Water Black Coffee (sugar ok,  NO MILK/CREAM OR CREAMERS)  Tea (sugar ok, NO MILK/CREAM OR CREAMERS) regular and decaf                             Plain Jell-O  with no fruit (NO RED)                                           Fruit ices (not with fruit pulp, NO RED)                                     Popsicles (NO RED)                                                                  Juice: NO CITRUS JUICES: only apple, WHITE grape, WHITE cranberry Sports drinks like Gatorade or Powerade (NO RED)                  FOLLOW BOWEL PREP AND ANY ADDITIONAL PRE OP  INSTRUCTIONS YOU RECEIVED FROM YOUR SURGEON'S OFFICE!!!   Oral Hygiene is also important to reduce your risk of infection.        Remember - BRUSH YOUR TEETH THE MORNING OF SURGERY WITH YOUR REGULAR TOOTHPASTE  Do NOT smoke after Midnight the night before surgery.  STOP TAKING all Vitamins, Herbs and supplements 1 week before your surgery.   Take ONLY these medicines the morning of surgery with A SIP OF WATER: amlodipine,  You may use your Flonase nasal spray if needed.                    You may not have any metal on your body including hair pins, jewelry, and body piercing  Do not wear make-up, lotions, powders, perfumes  or deodorant  Do not wear nail polish including gel and S&S, artificial / acrylic nails, or any other type of covering on natural nails including finger and toenails. If you have artificial nails, gel coating, etc., that needs to be removed by a nail salon, Please have this removed prior to surgery. Not doing so may mean that your surgery could be cancelled or delayed if the Surgeon or anesthesia staff feels like they are unable to monitor you safely.   Do not shave 48 hours prior to surgery to avoid nicks in your skin which may contribute to postoperative infections.    Contacts, Hearing Aids, dentures or bridgework may not be worn into surgery. DENTURES WILL BE REMOVED PRIOR TO SURGERY PLEASE DO NOT APPLY "Poly grip" OR  ADHESIVES!!!  Patients discharged on the day of surgery will not be allowed to drive home.  Someone NEEDS to stay with you for the first 24 hours after anesthesia.  Do not bring your home medications to the hospital. The Pharmacy will dispense medications listed on your medication list to you during your admission in the Hospital.   Please read over the following fact sheets you were given: IF YOU HAVE QUESTIONS ABOUT YOUR PRE-OP INSTRUCTIONS, PLEASE CALL [phone]   [facility] - Preparing for Surgery Before surgery, you can play an important role.  Because skin is not sterile, your skin needs to be as free of germs as possible.  You can reduce the number of germs on your skin by washing with CHG (chlorahexidine gluconate) soap before surgery.  CHG is an antiseptic cleaner which kills germs and bonds with the skin to continue killing germs even after washing. Please DO NOT use if you have an allergy to CHG or antibacterial soaps.  If your skin becomes reddened/irritated stop using the CHG and inform your nurse when you arrive at Short Stay. Do not shave (including legs and underarms) for at least 48 hours prior to the first CHG shower.  You may shave your face/neck.  Please follow these instructions carefully:  1.  Shower with CHG Soap the night before surgery and the  morning of surgery.  2.  If you choose to wash your hair, wash your hair first as usual with your normal  shampoo.  3.  After you shampoo, rinse your hair and body thoroughly to remove the shampoo.                             4.  Use CHG as you would any other liquid soap.  You can apply chg directly to the skin and wash.  Gently with a scrungie or clean washcloth.  5.  Apply the CHG Soap to your body ONLY FROM THE NECK DOWN.   Do not use on face/ open                           Wound or open sores. Avoid contact with eyes, ears mouth and genitals (private parts).                       Wash face,  Genitals (private parts) with  your normal soap.             6.  Wash thoroughly, paying special attention to the area where your  surgery  will be performed.  7.  Thoroughly rinse your body with warm water from the neck down.  8.  DO NOT shower/wash with your normal soap after using and rinsing off the CHG Soap.            9.  Pat yourself dry with a clean towel.            10.  Wear clean pajamas.            11.  Place clean sheets on your bed the night of your first shower and do not  sleep with pets.  ON THE DAY OF SURGERY : Do not apply any lotions/deodorants the morning of surgery.  Please wear clean clothes to the hospital/surgery center.     FAILURE TO FOLLOW THESE INSTRUCTIONS MAY RESULT IN THE CANCELLATION OF YOUR SURGERY  PATIENT SIGNATURE_________________________________  NURSE SIGNATURE__________________________________  ________________________________________________________________________

## 2023-07-27 NOTE — Progress Notes (Signed)
COVID Vaccine received:  [x]  No []  Yes Date of any COVID positive Test in last 90 days:  none  PCP -   Angelica Pou, MD  Cardiologist - none  Chest x-ray - 09-12-2019  2v  Epic EKG - (05-29-2021) will repeat at PST  Stress Test -  ECHO -  Cardiac Cath -   PCR screen: []  Ordered & Completed           []   No Order but Needs PROFEND           [x]   N/A for this surgery  Surgery Plan:  [x]  Ambulatory                            []  Outpatient in bed                            []  Admit  Anesthesia:    [x]  General  []  Spinal                           []   Choice []   MAC  Bowel Prep - [x]  No  []   Yes ____clear liquid diet the day prior to surgery  Pacemaker / ICD device [x]  No []  Yes   Spinal Cord Stimulator:[x]  No []  Yes       History of Sleep Apnea? [x]  No []  Yes   CPAP used?- [x]  No []  Yes    Does the patient monitor blood sugar?          []  No []  Yes  [x]  N/A  Patient has: [x]  NO Hx DM   []  Pre-DM                 []  DM1  []   DM2   Blood Thinner / Instructions:  none Aspirin Instructions:  none  ERAS Protocol Ordered: []  No  [x]  Yes PRE-SURGERY []  ENSURE  []  G2  [x]  No Drink Ordered Patient is to be NPO after: 04:30  Activity level: Patient is able to climb a flight of stairs without difficulty; [x]  No CP  [x]  No SOB. Patient can perform ADLs without assistance.   Anesthesia review: HTN, GERD, anemia  Patient denies shortness of breath, fever, cough and chest pain at PAT appointment.  Patient verbalized understanding and agreement to the Pre-Surgical Instructions that were given to them at this PAT appointment. Patient was also educated of the need to review these PAT instructions again prior to her surgery.I reviewed the appropriate phone numbers to call if they have any and questions or concerns.

## 2023-07-28 ENCOUNTER — Encounter (HOSPITAL_COMMUNITY)
Admission: RE | Admit: 2023-07-28 | Discharge: 2023-07-28 | Disposition: A | Discharge status: Home / Self Care | Payer: 59 | Source: Ambulatory Visit | Attending: General Surgery | Admitting: General Surgery

## 2023-07-28 ENCOUNTER — Other Ambulatory Visit: Payer: Self-pay

## 2023-07-28 ENCOUNTER — Encounter (HOSPITAL_COMMUNITY): Payer: Self-pay

## 2023-07-28 VITALS — BP 133/88 | HR 76 | Temp 98.6°F | Resp 20 | Ht 64.0 in | Wt 210.0 lb

## 2023-07-28 DIAGNOSIS — I10 Essential (primary) hypertension: Secondary | ICD-10-CM | POA: Diagnosis not present

## 2023-07-28 DIAGNOSIS — Z01818 Encounter for other preprocedural examination: Secondary | ICD-10-CM | POA: Insufficient documentation

## 2023-07-28 DIAGNOSIS — K8020 Calculus of gallbladder without cholecystitis without obstruction: Secondary | ICD-10-CM | POA: Diagnosis not present

## 2023-07-28 HISTORY — DX: Headache, unspecified: R51.9

## 2023-07-28 HISTORY — DX: Anxiety disorder, unspecified: F41.9

## 2023-07-28 LAB — CBC
HCT: 41.7 % (ref 36.0–46.0)
Hemoglobin: 13.6 g/dL (ref 12.0–15.0)
MCH: 28.8 pg (ref 26.0–34.0)
MCHC: 32.6 g/dL (ref 30.0–36.0)
MCV: 88.3 fL (ref 80.0–100.0)
Platelets: 238 10*3/uL (ref 150–400)
RBC: 4.72 MIL/uL (ref 3.87–5.11)
RDW: 12.8 % (ref 11.5–15.5)
WBC: 7.3 10*3/uL (ref 4.0–10.5)
nRBC: 0 % (ref 0.0–0.2)

## 2023-07-28 LAB — COMPREHENSIVE METABOLIC PANEL
ALT: 17 U/L (ref 0–44)
AST: 18 U/L (ref 15–41)
Albumin: 3.8 g/dL (ref 3.5–5.0)
Alkaline Phosphatase: 57 U/L (ref 38–126)
Anion gap: 10 (ref 5–15)
BUN: 13 mg/dL (ref 6–20)
CO2: 24 mmol/L (ref 22–32)
Calcium: 9.1 mg/dL (ref 8.9–10.3)
Chloride: 103 mmol/L (ref 98–111)
Creatinine, Ser: 1.02 mg/dL — ABNORMAL HIGH (ref 0.44–1.00)
GFR, Estimated: >60 mL/min (ref >60)
Glucose, Bld: 99 mg/dL (ref 70–99)
Potassium: 3.8 mmol/L (ref 3.5–5.1)
Sodium: 137 mmol/L (ref 135–145)
Total Bilirubin: 0.7 mg/dL (ref 0.3–1.2)
Total Protein: 7.8 g/dL (ref 6.5–8.1)

## 2023-08-07 NOTE — Anesthesia Preprocedure Evaluation (Signed)
Anesthesia Evaluation   Patient awake    Reviewed: Allergy & Precautions, Patient's Chart, lab work & pertinent test results  History of Anesthesia Complications Negative for: history of anesthetic complications  Airway Mallampati: I  TM Distance: >3 FB Neck ROM: Full    Dental  (+) Chipped, Dental Advisory Given   Pulmonary neg pulmonary ROS   breath sounds clear to auscultation       Cardiovascular hypertension, Pt. on medications (-) angina  Rhythm:Regular Rate:Normal     Neuro/Psych negative neurological ROS  negative psych ROS   GI/Hepatic Neg liver ROS,GERD  Controlled,,  Endo/Other    Morbid obesity  Renal/GU negative Renal ROS  negative genitourinary   Musculoskeletal  (+) Arthritis , Osteoarthritis,    Abdominal  (+) + obese  Peds  Hematology negative hematology ROS (+)   Anesthesia Other Findings   Reproductive/Obstetrics                             Anesthesia Physical Anesthesia Plan  ASA: 2  Anesthesia Plan: General   Post-op Pain Management: Ofirmev IV (intra-op)*   Induction: Intravenous  PONV Risk Score and Plan: 3 and Ondansetron, Dexamethasone and Midazolam  Airway Management Planned: Oral ETT  Additional Equipment: None  Intra-op Plan:   Post-operative Plan: Extubation in OR  Informed Consent: I have reviewed the patients History and Physical, chart, labs and discussed the procedure including the risks, benefits and alternatives for the proposed anesthesia with the patient or authorized representative who has indicated his/her understanding and acceptance.     Dental advisory given  Plan Discussed with: CRNA and Anesthesiologist  Anesthesia Plan Comments:         Anesthesia Quick Evaluation

## 2023-08-09 ENCOUNTER — Other Ambulatory Visit: Payer: Self-pay

## 2023-08-09 ENCOUNTER — Encounter (HOSPITAL_COMMUNITY)
Admission: RE | Disposition: A | Discharge status: Home / Self Care | Payer: Self-pay | Source: Home / Self Care | Attending: General Surgery

## 2023-08-09 ENCOUNTER — Ambulatory Visit (HOSPITAL_COMMUNITY): Payer: 59 | Admitting: Anesthesiology

## 2023-08-09 ENCOUNTER — Ambulatory Visit (HOSPITAL_BASED_OUTPATIENT_CLINIC_OR_DEPARTMENT_OTHER): Payer: 59 | Admitting: Anesthesiology

## 2023-08-09 ENCOUNTER — Encounter (HOSPITAL_COMMUNITY): Payer: Self-pay | Admitting: General Surgery

## 2023-08-09 ENCOUNTER — Ambulatory Visit (HOSPITAL_COMMUNITY)
Admission: RE | Admit: 2023-08-09 | Discharge: 2023-08-09 | Disposition: A | Discharge status: Home / Self Care | Payer: 59 | Attending: General Surgery | Admitting: General Surgery

## 2023-08-09 DIAGNOSIS — I10 Essential (primary) hypertension: Secondary | ICD-10-CM | POA: Diagnosis not present

## 2023-08-09 DIAGNOSIS — Z6835 Body mass index (BMI) 35.0-35.9, adult: Secondary | ICD-10-CM | POA: Diagnosis not present

## 2023-08-09 DIAGNOSIS — Z79899 Other long term (current) drug therapy: Secondary | ICD-10-CM | POA: Insufficient documentation

## 2023-08-09 DIAGNOSIS — Z9079 Acquired absence of other genital organ(s): Secondary | ICD-10-CM | POA: Insufficient documentation

## 2023-08-09 DIAGNOSIS — Z9049 Acquired absence of other specified parts of digestive tract: Secondary | ICD-10-CM | POA: Diagnosis not present

## 2023-08-09 DIAGNOSIS — R1310 Dysphagia, unspecified: Secondary | ICD-10-CM | POA: Diagnosis not present

## 2023-08-09 DIAGNOSIS — K5900 Constipation, unspecified: Secondary | ICD-10-CM | POA: Diagnosis not present

## 2023-08-09 DIAGNOSIS — K430 Incisional hernia with obstruction, without gangrene: Secondary | ICD-10-CM | POA: Insufficient documentation

## 2023-08-09 DIAGNOSIS — K8018 Calculus of gallbladder with other cholecystitis without obstruction: Secondary | ICD-10-CM | POA: Diagnosis not present

## 2023-08-09 DIAGNOSIS — K8010 Calculus of gallbladder with chronic cholecystitis without obstruction: Secondary | ICD-10-CM | POA: Insufficient documentation

## 2023-08-09 DIAGNOSIS — Z90710 Acquired absence of both cervix and uterus: Secondary | ICD-10-CM | POA: Insufficient documentation

## 2023-08-09 DIAGNOSIS — K219 Gastro-esophageal reflux disease without esophagitis: Secondary | ICD-10-CM | POA: Insufficient documentation

## 2023-08-09 DIAGNOSIS — K819 Cholecystitis, unspecified: Secondary | ICD-10-CM

## 2023-08-09 DIAGNOSIS — M6200 Separation of muscle (nontraumatic), unspecified site: Secondary | ICD-10-CM | POA: Diagnosis not present

## 2023-08-09 DIAGNOSIS — Z90722 Acquired absence of ovaries, bilateral: Secondary | ICD-10-CM | POA: Diagnosis not present

## 2023-08-09 HISTORY — PX: CHOLECYSTECTOMY: SHX55

## 2023-08-09 SURGERY — LAPAROSCOPIC CHOLECYSTECTOMY WITH INTRAOPERATIVE CHOLANGIOGRAM
Anesthesia: General

## 2023-08-09 MED ORDER — FENTANYL CITRATE PF 50 MCG/ML IJ SOSY
PREFILLED_SYRINGE | INTRAMUSCULAR | Status: AC
  Filled 2023-08-09: qty 1

## 2023-08-09 MED ORDER — ROCURONIUM BROMIDE 10 MG/ML (PF) SYRINGE
PREFILLED_SYRINGE | INTRAVENOUS | Status: DC | PRN
  Administered 2023-08-09: 60 mg via INTRAVENOUS

## 2023-08-09 MED ORDER — DEXAMETHASONE SODIUM PHOSPHATE 10 MG/ML IJ SOLN
INTRAMUSCULAR | Status: DC | PRN
  Administered 2023-08-09: 8 mg via INTRAVENOUS

## 2023-08-09 MED ORDER — SODIUM CHLORIDE 0.9 % IV SOLN
2.0000 g | INTRAVENOUS | Status: AC
  Administered 2023-08-09: 2 g via INTRAVENOUS
  Filled 2023-08-09: qty 2

## 2023-08-09 MED ORDER — PROPOFOL 10 MG/ML IV BOLUS
INTRAVENOUS | Status: AC
  Filled 2023-08-09: qty 20

## 2023-08-09 MED ORDER — CHLORHEXIDINE GLUCONATE CLOTH 2 % EX PADS: 6.0000 | MEDICATED_PAD | Freq: Once | CUTANEOUS | Status: DC

## 2023-08-09 MED ORDER — RINGERS IRRIGATION IR SOLN
Status: DC | PRN
  Administered 2023-08-09: 1000 mL

## 2023-08-09 MED ORDER — FENTANYL CITRATE PF 50 MCG/ML IJ SOSY
25.0000 ug | PREFILLED_SYRINGE | INTRAMUSCULAR | Status: DC | PRN
  Administered 2023-08-09: 25 ug via INTRAVENOUS
  Administered 2023-08-09: 50 ug via INTRAVENOUS
  Administered 2023-08-09: 25 ug via INTRAVENOUS

## 2023-08-09 MED ORDER — OXYCODONE HCL 5 MG/5ML PO SOLN: 5.0000 mg | Freq: Once | ORAL | Status: DC | PRN

## 2023-08-09 MED ORDER — ACETAMINOPHEN 500 MG PO TABS
1000.0000 mg | ORAL_TABLET | ORAL | Status: AC
  Administered 2023-08-09: 1000 mg via ORAL
  Filled 2023-08-09: qty 2

## 2023-08-09 MED ORDER — ROCURONIUM BROMIDE 10 MG/ML (PF) SYRINGE
PREFILLED_SYRINGE | INTRAVENOUS | Status: AC
  Filled 2023-08-09: qty 40

## 2023-08-09 MED ORDER — ACETAMINOPHEN 160 MG/5ML PO SOLN: 325.0000 mg | ORAL | Status: DC | PRN

## 2023-08-09 MED ORDER — BUPIVACAINE HCL (PF) 0.25 % IJ SOLN
INTRAMUSCULAR | Status: DC | PRN
  Administered 2023-08-09: 30 mL

## 2023-08-09 MED ORDER — DEXAMETHASONE SODIUM PHOSPHATE 10 MG/ML IJ SOLN
INTRAMUSCULAR | Status: AC
  Filled 2023-08-09: qty 4

## 2023-08-09 MED ORDER — ONDANSETRON HCL 4 MG/2ML IJ SOLN
INTRAMUSCULAR | Status: AC
  Filled 2023-08-09: qty 8

## 2023-08-09 MED ORDER — CHLORHEXIDINE GLUCONATE 0.12 % MT SOLN
15.0000 mL | Freq: Once | OROMUCOSAL | Status: AC
  Administered 2023-08-09: 15 mL via OROMUCOSAL

## 2023-08-09 MED ORDER — LABETALOL HCL 5 MG/ML IV SOLN
INTRAVENOUS | Status: DC | PRN
  Administered 2023-08-09: 5 mg via INTRAVENOUS

## 2023-08-09 MED ORDER — LIDOCAINE HCL (PF) 2 % IJ SOLN
INTRAMUSCULAR | Status: AC
  Filled 2023-08-09: qty 20

## 2023-08-09 MED ORDER — ONDANSETRON HCL 4 MG/2ML IJ SOLN
INTRAMUSCULAR | Status: DC | PRN
  Administered 2023-08-09: 4 mg via INTRAVENOUS

## 2023-08-09 MED ORDER — MEPERIDINE HCL 50 MG/ML IJ SOLN: 6.2500 mg | INTRAMUSCULAR | Status: DC | PRN

## 2023-08-09 MED ORDER — FENTANYL CITRATE (PF) 100 MCG/2ML IJ SOLN
INTRAMUSCULAR | Status: DC | PRN
  Administered 2023-08-09: 100 ug via INTRAVENOUS

## 2023-08-09 MED ORDER — ONDANSETRON HCL 4 MG/2ML IJ SOLN
INTRAMUSCULAR | Status: AC
  Filled 2023-08-09: qty 2

## 2023-08-09 MED ORDER — FENTANYL CITRATE (PF) 100 MCG/2ML IJ SOLN
INTRAMUSCULAR | Status: AC
  Filled 2023-08-09: qty 2

## 2023-08-09 MED ORDER — OXYCODONE HCL 5 MG PO TABS: 5.0000 mg | ORAL_TABLET | Freq: Once | ORAL | Status: DC | PRN

## 2023-08-09 MED ORDER — 0.9 % SODIUM CHLORIDE (POUR BTL) OPTIME
TOPICAL | Status: DC | PRN
  Administered 2023-08-09: 1000 mL

## 2023-08-09 MED ORDER — ACETAMINOPHEN 325 MG PO TABS: 325.0000 mg | ORAL_TABLET | ORAL | Status: DC | PRN

## 2023-08-09 MED ORDER — SPY AGENT GREEN - (INDOCYANINE FOR INJECTION)
1.2500 mg | Freq: Once | INTRAMUSCULAR | Status: AC
  Administered 2023-08-09: 1.25 mg via INTRAVENOUS
  Filled 2023-08-09: qty 10

## 2023-08-09 MED ORDER — MIDAZOLAM HCL 5 MG/5ML IJ SOLN
INTRAMUSCULAR | Status: DC | PRN
  Administered 2023-08-09: 2 mg via INTRAVENOUS

## 2023-08-09 MED ORDER — PROPOFOL 10 MG/ML IV BOLUS
INTRAVENOUS | Status: DC | PRN
  Administered 2023-08-09: 140 mg via INTRAVENOUS

## 2023-08-09 MED ORDER — LACTATED RINGERS IV SOLN: INTRAVENOUS | Status: DC

## 2023-08-09 MED ORDER — ORAL CARE MOUTH RINSE: 15.0000 mL | Freq: Once | OROMUCOSAL | Status: AC

## 2023-08-09 MED ORDER — ONDANSETRON HCL 4 MG/2ML IJ SOLN: 4.0000 mg | Freq: Once | INTRAMUSCULAR | Status: DC | PRN

## 2023-08-09 MED ORDER — BUPIVACAINE HCL (PF) 0.25 % IJ SOLN
INTRAMUSCULAR | Status: AC
  Filled 2023-08-09: qty 30

## 2023-08-09 MED ORDER — SUGAMMADEX SODIUM 200 MG/2ML IV SOLN
INTRAVENOUS | Status: DC | PRN
  Administered 2023-08-09: 200 mg via INTRAVENOUS

## 2023-08-09 MED ORDER — MIDAZOLAM HCL 2 MG/2ML IJ SOLN
INTRAMUSCULAR | Status: AC
  Filled 2023-08-09: qty 2

## 2023-08-09 MED ORDER — LABETALOL HCL 5 MG/ML IV SOLN
INTRAVENOUS | Status: AC
  Filled 2023-08-09: qty 4

## 2023-08-09 MED ORDER — DEXMEDETOMIDINE HCL IN NACL 80 MCG/20ML IV SOLN
INTRAVENOUS | Status: DC | PRN
  Administered 2023-08-09: 12 ug via INTRAVENOUS

## 2023-08-09 MED ORDER — LIDOCAINE 2% (20 MG/ML) 5 ML SYRINGE
INTRAMUSCULAR | Status: DC | PRN
  Administered 2023-08-09: 100 mg via INTRAVENOUS

## 2023-08-09 SURGICAL SUPPLY — 55 items
ADH SKN CLS APL DERMABOND .7 (GAUZE/BANDAGES/DRESSINGS)
APL PRP STRL LF DISP 70% ISPRP (MISCELLANEOUS) ×1
APL SRG 38 LTWT LNG FL B (MISCELLANEOUS)
APPLICATOR ARISTA FLEXITIP XL (MISCELLANEOUS) IMPLANT
APPLIER CLIP 5 13 M/L LIGAMAX5 (MISCELLANEOUS) ×1
APPLIER CLIP ROT 10 11.4 M/L (STAPLE)
APR CLP MED LRG 11.4X10 (STAPLE)
APR CLP MED LRG 5 ANG JAW (MISCELLANEOUS) ×1
BAG COUNTER SPONGE SURGICOUNT (BAG) IMPLANT
BAG SPEC RTRVL 10 TROC 200 (ENDOMECHANICALS) ×1
BAG SPNG CNTER NS LX DISP (BAG)
CABLE HIGH FREQUENCY MONO STRZ (ELECTRODE) ×1 IMPLANT
CHLORAPREP W/TINT 26 (MISCELLANEOUS) ×1 IMPLANT
CLIP APPLIE 5 13 M/L LIGAMAX5 (MISCELLANEOUS) IMPLANT
CLIP APPLIE ROT 10 11.4 M/L (STAPLE) IMPLANT
CLIP LIGATING HEMO O LOK GREEN (MISCELLANEOUS) IMPLANT
COVER MAYO STAND XLG (MISCELLANEOUS) IMPLANT
COVER SURGICAL LIGHT HANDLE (MISCELLANEOUS) ×1 IMPLANT
DERMABOND ADVANCED .7 DNX12 (GAUZE/BANDAGES/DRESSINGS) IMPLANT
DRAPE C-ARM 42X120 X-RAY (DRAPES) IMPLANT
DRSG TEGADERM 2-3/8X2-3/4 SM (GAUZE/BANDAGES/DRESSINGS) ×3 IMPLANT
DRSG TEGADERM 4X4.75 (GAUZE/BANDAGES/DRESSINGS) ×1 IMPLANT
ELECT REM PT RETURN 15FT ADLT (MISCELLANEOUS) ×1 IMPLANT
GAUZE SPONGE 2X2 8PLY STRL LF (GAUZE/BANDAGES/DRESSINGS) ×1 IMPLANT
GLOVE BIO SURGEON STRL SZ7.5 (GLOVE) ×1 IMPLANT
GLOVE INDICATOR 8.0 STRL GRN (GLOVE) ×1 IMPLANT
GOWN STRL REUS W/ TWL XL LVL3 (GOWN DISPOSABLE) ×1 IMPLANT
GOWN STRL REUS W/TWL XL LVL3 (GOWN DISPOSABLE) ×1
GRASPER SUT TROCAR 14GX15 (MISCELLANEOUS) IMPLANT
HEMOSTAT ARISTA ABSORB 3G PWDR (HEMOSTASIS) IMPLANT
HEMOSTAT SNOW SURGICEL 2X4 (HEMOSTASIS) IMPLANT
IRRIG SUCT STRYKERFLOW 2 WTIP (MISCELLANEOUS) ×1
IRRIGATION SUCT STRKRFLW 2 WTP (MISCELLANEOUS) ×1 IMPLANT
KIT BASIN OR (CUSTOM PROCEDURE TRAY) ×1 IMPLANT
KIT TURNOVER KIT A (KITS) IMPLANT
L-HOOK LAP DISP 36CM (ELECTROSURGICAL)
LHOOK LAP DISP 36CM (ELECTROSURGICAL) IMPLANT
POUCH RETRIEVAL ECOSAC 10 (ENDOMECHANICALS) ×1 IMPLANT
SCISSORS LAP 5X35 DISP (ENDOMECHANICALS) ×1 IMPLANT
SET CHOLANGIOGRAPH MIX (MISCELLANEOUS) IMPLANT
SET TUBE SMOKE EVAC HIGH FLOW (TUBING) ×1 IMPLANT
SLEEVE ADV FIXATION 5X100MM (TROCAR) ×1 IMPLANT
SPIKE FLUID TRANSFER (MISCELLANEOUS) ×1 IMPLANT
STRIP CLOSURE SKIN 1/2X4 (GAUZE/BANDAGES/DRESSINGS) ×1 IMPLANT
SUT MNCRL AB 4-0 PS2 18 (SUTURE) ×1 IMPLANT
SUT VIC AB 0 UR5 27 (SUTURE) IMPLANT
SUT VICRYL 0 TIES 12 18 (SUTURE) IMPLANT
SUT VICRYL 0 UR6 27IN ABS (SUTURE) IMPLANT
TOWEL OR 17X26 10 PK STRL BLUE (TOWEL DISPOSABLE) ×1 IMPLANT
TOWEL OR NON WOVEN STRL DISP B (DISPOSABLE) ×1 IMPLANT
TRAY LAPAROSCOPIC (CUSTOM PROCEDURE TRAY) ×1 IMPLANT
TROCAR ADV FIXATION 12X100MM (TROCAR) IMPLANT
TROCAR ADV FIXATION 5X100MM (TROCAR) ×1 IMPLANT
TROCAR BALLN 12MMX100 BLUNT (TROCAR) IMPLANT
TROCAR XCEL NON-BLD 5MMX100MML (ENDOMECHANICALS) IMPLANT

## 2023-08-09 NOTE — Op Note (Signed)
Stefanie Moon [id number] 1971/07/01 08/09/2023  Laparoscopic Cholecystectomy with near infrared fluorescent cholangiography procedure Note  Indications: This patient presents with symptomatic gallbladder disease and will undergo laparoscopic cholecystectomy.  Pre-operative Diagnosis: Calculus of gallbladder with other cholecystitis, without mention of obstruction  Post-operative Diagnosis: Same  Surgeon: Gaynelle Adu MD FACS  Assistants: none  Anesthesia: General endotracheal anesthesia  Procedure Details  The patient was seen again in the Holding Room. The risks, benefits, complications, treatment options, and expected outcomes were discussed with the patient. The possibilities of reaction to medication, pulmonary aspiration, perforation of viscus, bleeding, recurrent infection, finding a normal gallbladder, the need for additional procedures, failure to diagnose a condition, the possible need to convert to an open procedure, and creating a complication requiring transfusion or operation were discussed with the patient. The likelihood of improving the patient's symptoms with return to their baseline status is good.  The patient and/or family concurred with the proposed plan, giving informed consent. The site of surgery properly noted. The patient was taken to Operating Room, identified as Stefanie Moon and the procedure verified as Laparoscopic Cholecystectomy with ICG dye.  A Time Out was held and the above information confirmed. Antibiotic prophylaxis was administered.    ICG dye was administered preoperatively.    General endotracheal anesthesia was then administered and tolerated well. After the induction, the abdomen was prepped with Chloraprep and draped in the sterile fashion. The patient was positioned in the supine position.  The patient had a pre-existing umbilical incisional hernia from her prior procedure.  It was fat-containing on CT.  Since this was not going to be a  clean procedure I told the patient that we would avoid going through her umbilical hernia since we would not be able to use mesh today.  Therefore Optiview technique was used to gain access to the abdomen.  A small incision was made in the left upper quadrant slightly off from the midline just below the left subcostal margin.  Then using a 0 degree 5 mm laparoscope through a 5 mm trocar I advanced it through all layers of the abdominal wall and carefully entered the abdominal cavity.  Pneumoperitoneum was smoothly established up to a patient pressure of 15 mmHg without any change in patient vital signs.  The laparoscope was advanced and the abdominal cavity was surveilled.  There is no evidence of injury to surrounding structures.  The laparoscope was turned toward the umbilical area.  The patient had a fat-containing umbilical incisional hernia.  I placed a 5 mm trocar in the supraumbilical position above the hernia.  Two 5-mm ports were placed in the right upper quadrant. All skin incisions were infiltrated with a local anesthetic agent before making the incision and placing the trocars.   We positioned the patient in reverse Trendelenburg, tilted slightly to the patient's left.  The gallbladder was identified, the fundus grasped and retracted cephalad.  The gallbladder was distended.  Adhesions were lysed bluntly and with the electrocautery where indicated, taking care not to injure any adjacent organs or viscus. The infundibulum was grasped and retracted laterally, exposing the peritoneum overlying the triangle of Calot. This was then divided and exposed in a blunt fashion. A critical view of the cystic duct and cystic artery was obtained.  The cystic duct was clearly identified and bluntly dissected circumferentially.  Utilizing the Stryker camera system near infrared fluorescent activity was visualized in the liver, cystic duct, common hepatic duct and common bile duct and small bowel.  This served as a  secondary confirmation of our anatomy.  The cystic duct was then ligated with clips and divided. The cystic artery which had been identified & dissected free was ligated with clips and divided as well.   The gallbladder was dissected from the liver bed in retrograde fashion with the electrocautery.  Mobilizing the gallbladder off of the liver surface, the posterior gallbladder wall was very thin.  The gallbladder was entered.  There was spillage of bile but no gallstone spillage.  The gallbladder was removed and placed in an Ecco sac.  Hemostasis was achieved with the electrocautery. Copious irrigation was utilized and was repeatedly aspirated until clear. The gallbladder and Ecco sac were then removed through the left upper quadrant port site exchanging the 5 mm trocar with a 11 mm trocar. The liver bed was irrigated and inspected.  2 interrupted 0 Vicryl sutures were used to close the fascial defect with a PMI suture passer at the extraction site in the left upper quadrant.    We again inspected the right upper quadrant for hemostasis.  The left upper quadrant closure was inspected and there was no air leak and nothing trapped within the closure. Pneumoperitoneum was released as we removed the trocars.  4-0 Monocryl was used to close the skin.   Dermabond was applied. The patient was then extubated and brought to the recovery room in stable condition. Instrument, sponge, and needle counts were correct at closure and at the conclusion of the case.   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         Disposition: PACU - hemodynamically stable.         Condition: stable  Mary Sella. Andrey Campanile, MD,  FACS General, Bariatric, & Minimally Invasive Surgery [facility],  [facility]

## 2023-08-09 NOTE — H&P (Signed)
REFERRING PHYSICIAN: Angelica Pou  PROVIDER:  Sherril Cong, MD  MRN: [id number] DOB: February 13, 1971 DATE OF ENCOUNTER: 07/15/2023  Subjective   Chief Complaint: New Patient (Ventral hernia gallbladder )   History of Present Illness: Stefanie Moon is a 52 y.o. female who is seen today as an office consultation at the request of Dr. Samuel Bouche for evaluation of New Patient (Ventral hernia gallbladder ) .   The patient has multiple GI complaints. She states that she gets a stuck sensation in her lower throat upper chest when eating solids. It occurs on a daily basis. She will also get a sharp pain underneath her left breast when eating. She does not have trouble with liquids. When she gets the stuck sensation with solids she occasionally has to vomit to get the food up. She feels bloating after eating. She mainly gets bloated at night. She states that she has bad heartburn. She does take pantoprazole once a day. She also complains of right flank pain and points to her right side sort at the lower level of her rib cage on the right side. She states that she gets discomfort generally after eating. It last for about 1 hour. It is associated with lots of flatus and nausea. She will also have pain underneath her right rib cage. Using a heating pad will ease it up. Physical activity does not bring it on or exacerbated.  She also complains of a bulge at her umbilicus. If she coughs or sneezes she will have a fair amount of pain around her central abdomen. She has had an appendectomy and a total abdominal hysterectomy for fibroids.  She denies any tobacco or alcohol or drug use. She is had an upper and lower scope.    Review of Systems: A complete review of systems was obtained from the patient. I have reviewed this information and discussed as appropriate with the patient. See HPI as well for other ROS.  ROS   Medical History: Past Medical History:  Diagnosis Date  Anemia  Arthritis  GERD  (gastroesophageal reflux disease)   There is no problem list on file for this patient.  Past Surgical History:  Procedure Laterality Date  HYSTERECTOMY TOTAL ABDOMINAL W/REMOVAL TUBES &/OR OVARIES 09/03/2020  Total abdominal hysterectomy, bilateral salpingectomy, cystoscopy  APPENDECTOMY    Allergies  Allergen Reactions  Tramadol Hives, Itching and Rash   Current Outpatient Medications on File Prior to Visit  Medication Sig Dispense Refill  amLODIPine (NORVASC) 10 MG tablet Take 10 mg by mouth once daily  ergocalciferol, vitamin D2, 1,250 mcg (50,000 unit) capsule  pantoprazole (PROTONIX) 40 MG DR tablet Take 40 mg by mouth once daily   No current facility-administered medications on file prior to visit.   Family History  Problem Relation Age of Onset  High blood pressure (Hypertension) Mother  Colon cancer Father  High blood pressure (Hypertension) Father    Social History   Tobacco Use  Smoking Status Never  Smokeless Tobacco Never    Social History   Socioeconomic History  Marital status: Single  Tobacco Use  Smoking status: Never  Smokeless tobacco: Never  Substance and Sexual Activity  Alcohol use: Yes  Drug use: Never   Objective:   Vitals:  07/15/23 0916  BP: (!) 142/90  Pulse: 88  Temp: 36.6 C (97.8 F)  SpO2: 96%  Weight: 97.7 kg (215 lb 6.4 oz)  Height: 162.6 cm (5\' 4" )   Body mass index is 36.97 kg/m.  Constitutional: NAD; conversant; no deformities; obese  Eyes: Moist conjunctiva; no lid lag; anicteric; PERRL Neck: Trachea midline; no thyromegaly Lungs: Normal respiratory effort; no tactile fremitus CV: RRR; no palpable thrills; no pitting edema GI: Abd soft, bulge at umbilicus with scar, hernia at umbilicus 5 cm; not reducible, mild TTP throughout upper abd and central abd; no palpable hepatosplenomegaly MSK: Normal gait; no clubbing/cyanosis Psychiatric: Appropriate affect; alert and oriented x3 Lymphatic: No palpable cervical or  axillary lymphadenopathy Skin:no rash/lesions  Labs, Imaging and Diagnostic Testing: Colonoscopy 02/11/23  ED note 01/15/23  CT a/p 01/15/23 IMPRESSION: 1. Descending and sigmoid colonic diverticulosis without CT evidence for acute diverticulitis. 2. Complex fat containing ventral abdominal wall hernia with mild stranding. 3. Cholelithiasis. Probable stone in the gallbladder neck.  Labs 01/15/23  EGD 08/07/22  Obgyn op note 9/21 Assessment and Plan:    Diagnoses and all orders for this visit:  Symptomatic cholelithiasis  Incarcerated incisional hernia  Dysphagia, unspecified type  Heartburn  Obesity (BMI 30-39.9), unspecified  Diastasis of muscle  Constipation in female    The patient has multiple varied GI complaints. I do believe she does have symptomatic cholelithiasis is based on postprandial right upper quadrant pain rating to her back with nausea and known gallstones as well as a gallstone in the neck of the gallbladder.  I would not recommend concomitant ventral incarcerated incisional hernia repair at the same time as cholecystectomy. She does have an incisional hernia at her umbilicus probably due to her prior appendectomy. The hernia is in the middle of a diastases as well. So therefore the patient would need mesh repair. I explained that during gallbladder surgery it is not technically considered a clean procedure because there is potential for spillage from the gallbladder and we would not want to get the mesh infected which could lead to chronic issues so I would not recommend concomitant hernia repair and cholecystectomy. We can first proceed with cholecystectomy and continue the workup or for other GI complaints.  Her hernia contains omentum so I think the likelihood of her having bowel trapped is quite low.  As I explained to her I do not believe her gallstones or hernia is causing her symptoms of dysphagia as well as left upper quadrant pain. She reports  dysphagia to solid foods. She had an endoscopy about a year ago that showed some mild dilatation and potential motility concerns. I think the easiest test to start with would be to start with an esophagram. I told her she will probably need to follow-up with her gastroenterologist for additional workup and evaluation of her symptoms of dysphagia, heartburn and sharp upper quadrant pain.  She also has constipation and we discussed constipation management. She was given an educational handout about constipation.  Then discussed laparoscopic cholecystectomy  I believe the patient's some of the pt's symptoms are consistent with gallbladder disease.  We discussed gallbladder disease. The patient was given Agricultural engineer. We discussed non-operative and operative management. We discussed the signs & symptoms of acute cholecystitis  I discussed laparoscopic cholecystectomy with possible IOC in detail. The patient was given educational material as well as diagrams detailing the procedure. We discussed the risks and benefits of a laparoscopic cholecystectomy including, but not limited to bleeding, infection, injury to surrounding structures such as the intestine or liver, bile leak, retained gallstones, need to convert to an open procedure, prolonged diarrhea, blood clots such as DVT, common bile duct injury, anesthesia risks, and possible need for additional procedures. We discussed the typical post-operative recovery course. I explained  that the likelihood of improvement of their RUQ symptoms is good.  This patient encounter took 48 minutes today to perform the following: take history, perform exam, review outside records, interpret imaging, counsel the patient on their diagnosis and document encounter, findings & plan in the EHR  No follow-ups on file.   Sherril Cong, MD  General, Minimally Invasive, & Bariatric Surgery

## 2023-08-09 NOTE — Transfer of Care (Signed)
Immediate Anesthesia Transfer of Care Note  Patient: Stefanie Moon  Procedure(s) Performed: LAPAROSCOPIC CHOLECYSTECTOMY WITH ICG  Patient Location: PACU  Anesthesia Type:General  Level of Consciousness: awake  Airway & Oxygen Therapy: Patient Spontanous Breathing  Post-op Assessment: Report given to RN, Post -op Vital signs reviewed and stable, and Patient moving all extremities  Post vital signs: Reviewed and stable  Last Vitals:  Vitals Value Taken Time  BP 138/84 08/09/23 0901  Temp    Pulse 77 08/09/23 0904  Resp 20 08/09/23 0904  SpO2 97 % 08/09/23 0904  Vitals shown include unfiled device data.  Last Pain:  Vitals:   08/09/23 0557  TempSrc:   PainSc: 8          Complications: No notable events documented.

## 2023-08-09 NOTE — Discharge Instructions (Signed)

## 2023-08-09 NOTE — Interval H&P Note (Signed)
History and Physical Interval Note:  08/09/2023 7:28 AM  Stefanie Moon  has presented today for surgery, with the diagnosis of CHOLECYSTITIS.  The various methods of treatment have been discussed with the patient and family. After consideration of risks, benefits and other options for treatment, the patient has consented to  Procedure(s): LAPAROSCOPIC CHOLECYSTECTOMY WITH INTRAOPERATIVE CHOLANGIOGRAM WITH ICG (N/A) as a surgical intervention.  The patient's history has been reviewed, patient examined, no change in status, stable for surgery.  I have reviewed the patient's chart and labs.  Questions were answered to the patient's satisfaction.     Gaynelle Adu

## 2023-08-10 ENCOUNTER — Encounter (HOSPITAL_COMMUNITY): Payer: Self-pay | Admitting: General Surgery

## 2023-08-10 NOTE — Anesthesia Postprocedure Evaluation (Signed)
Anesthesia Post Note  Patient: Stefanie Moon  Procedure(s) Performed: LAPAROSCOPIC CHOLECYSTECTOMY WITH ICG     Patient location during evaluation: PACU Anesthesia Type: General Level of consciousness: awake and alert Pain management: pain level controlled Vital Signs Assessment: post-procedure vital signs reviewed and stable Respiratory status: spontaneous breathing, nonlabored ventilation, respiratory function stable and patient connected to nasal cannula oxygen Cardiovascular status: blood pressure returned to baseline and stable Postop Assessment: no apparent nausea or vomiting Anesthetic complications: no   No notable events documented.  Last Vitals:  Vitals:   08/09/23 1015 08/09/23 1029  BP: 125/80 120/85  Pulse: 75 73  Resp: 17   Temp:  37.1 C  SpO2: 94% 97%    Last Pain:  Vitals:   08/09/23 1029  TempSrc:   PainSc: 7                  ,

## 2023-09-06 ENCOUNTER — Ambulatory Visit
Admission: RE | Admit: 2023-09-06 | Discharge: 2023-09-06 | Disposition: A | Discharge status: Home / Self Care | Payer: 59 | Source: Ambulatory Visit | Attending: Family Medicine | Admitting: Family Medicine

## 2023-09-06 VITALS — BP 153/97 | HR 88 | Temp 98.3°F | Resp 16

## 2023-09-06 DIAGNOSIS — Z1152 Encounter for screening for COVID-19: Secondary | ICD-10-CM | POA: Diagnosis not present

## 2023-09-06 DIAGNOSIS — J069 Acute upper respiratory infection, unspecified: Secondary | ICD-10-CM | POA: Insufficient documentation

## 2023-09-06 MED ORDER — PREDNISONE 50 MG PO TABS: 50.0000 mg | ORAL_TABLET | Freq: Every day | ORAL | 0 refills | Status: AC

## 2023-09-06 NOTE — ED Triage Notes (Signed)
Patient presents to UC for bilateral ear drainage, ear irritation, nasal congestion, post-nasal drip, and right eye itching and drainage. Treating with alka-seltzer cold plus.   Denies fever.

## 2023-09-06 NOTE — ED Provider Notes (Signed)
[facility]    CSN: [id number] Arrival date & time: 09/06/23  0950      History   Chief Complaint Chief Complaint  Patient presents with   Ear Drainage    Ear been irritated itching bad constantly and my head been so stop up so much pressure around my nose and head . - Entered by patient   Appointment    HPI Stefanie Moon is a 52 y.o. female.    Ear Drainage  Patient is here for ear fullness x 3 days, then head pressure, runny nose, congestion.  Lots of pnd.  She woke up last night the right eye was itchy, irritated.  Crustiness and sore.  She did go to an event last week.  She has a lot of pressure in her sinuses.  Using otc meds without much help.  Some chills.  No fevers.  No home covid test.       Past Medical History:  Diagnosis Date   Anemia    Anxiety    Arthritis    knee   Blood transfusion without reported diagnosis    Fibroids    GERD (gastroesophageal reflux disease)    Headache    Hypertension     Patient Active Problem List   Diagnosis Date Noted   Dysphagia 06/10/2022   Constipation 06/10/2022   Primary osteoarthritis of left knee 05/27/2022   Primary osteoarthritis of right knee 05/27/2022   Uterine leiomyoma 09/03/2020   S/P abdominal hysterectomy 09/03/2020    Past Surgical History:  Procedure Laterality Date   APPENDECTOMY     CHOLECYSTECTOMY N/A 08/09/2023   Procedure: LAPAROSCOPIC CHOLECYSTECTOMY WITH ICG;  Surgeon: Gaynelle Adu, MD;  Location: [facility];  Service: General;  Laterality: N/A;   CYSTOSCOPY N/A 09/03/2020   Procedure: Bluford Kaufmann;  Surgeon: Lavina Hamman, MD;  Location: [facility] OR;  Service: Gynecology;  Laterality: N/A;   HYSTERECTOMY ABDOMINAL WITH SALPINGECTOMY Bilateral 09/03/2020   Procedure: HYSTERECTOMY ABDOMINAL WITH SALPINGECTOMY;  Surgeon: Lavina Hamman, MD;  Location: [facility] OR;  Service: Gynecology;  Laterality: Bilateral;  TAP Block   KNEE ARTHROSCOPY Left 07/02/2021   Procedure: ARTHROSCOPY KNEE,  LATERAL MENISECTOMY, CONDRAPLASTY, ASPIRATION OF BAKERS CYST;  Surgeon: Beverely Low, MD;  Location: [facility] OR;  Service: Orthopedics;  Laterality: Left;    OB History   No obstetric history on file.      Home Medications    Prior to Admission medications   Medication Sig Start Date End Date Taking? Authorizing Provider  amLODipine (NORVASC) 10 MG tablet Take 1 tablet (10 mg total) by mouth daily. 03/31/22   Grayce Sessions, NP  ciprofloxacin (CILOXAN) 0.3 % ophthalmic ointment Place into the right eye 2 (two) times daily. Patient not taking: Reported on 02/11/2023 05/13/22   Grayce Sessions, NP  ergocalciferol (VITAMIN D2) 1.25 MG (50000 UT) capsule Take 50,000 Units by mouth once a week.    [provider]  famotidine (PEPCID) 20 MG tablet Take 1 tablet (20 mg total) by mouth at bedtime. Patient not taking: Reported on 01/29/2023 06/10/22   Arnaldo Natal, NP  fluticasone [facility]) 50 MCG/ACT nasal spray Place 2 sprays into both nostrils daily. 05/13/22   Grayce Sessions, NP  naproxen (NAPROSYN) 375 MG tablet Take 1 tablet (375 mg total) by mouth 2 (two) times daily. Patient not taking: Reported on 01/29/2023 01/15/23   Linwood Dibbles, MD  ondansetron (ZOFRAN) 4 MG tablet Take 1 tablet (4 mg total) by mouth as directed. Take one  Zofran 4 mg tablet 30-60 minutes before each colonoscopy prep dose 01/29/23   Sherrilyn Rist, MD  oxyCODONE-acetaminophen (PERCOCET/ROXICET) 5-325 MG tablet Take 1 tablet by mouth every 6 (six) hours as needed for moderate pain. 06/24/23   [provider]  pantoprazole (PROTONIX) 40 MG tablet Take 1 tablet (40 mg total) by mouth daily. 03/31/22   Grayce Sessions, NP  Esomeprazole Magnesium (NEXIUM PO) Take by mouth daily.  02/01/21  [provider]  lisinopril (ZESTRIL) 20 MG tablet Take 20 mg by mouth daily.  02/01/21  [provider]    Family History Family History  Problem Relation Age of Onset   Colon polyps Mother     Hypertension Mother    Colon cancer Father    Liver disease Father    Hypertension Father    Colon polyps Sister    Heart disease Brother    Colon cancer Paternal Aunt    Kidney disease Maternal Grandmother    Hypertension Maternal Grandmother    Stomach cancer Neg Hx    Esophageal cancer Neg Hx    Rectal cancer Neg Hx     Social History Social History   Tobacco Use   Smoking status: Never   Smokeless tobacco: Never  Vaping Use   Vaping status: Never Used  Substance Use Topics   Alcohol use: Yes    Comment: social wine once a month   Drug use: No     Allergies   Tramadol   Review of Systems Review of Systems  Constitutional:  Positive for fatigue.  HENT:  Positive for congestion, rhinorrhea, sinus pressure and sinus pain.   Eyes:  Positive for discharge and itching.  Respiratory: Negative.    Cardiovascular: Negative.   Gastrointestinal: Negative.   Musculoskeletal: Negative.   Psychiatric/Behavioral: Negative.       Physical Exam Triage Vital Signs ED Triage Vitals  Encounter Vitals Group     BP 09/06/23 1019 (!) 153/97     Systolic BP Percentile --      Diastolic BP Percentile --      Pulse Rate 09/06/23 1019 88     Resp 09/06/23 1019 16     Temp 09/06/23 1019 98.3 F (36.8 C)     Temp Source 09/06/23 1019 Oral     SpO2 09/06/23 1019 98 %     Weight --      Height --      Head Circumference --      Peak Flow --      Pain Score 09/06/23 1018 8     Pain Loc --      Pain Education --      Exclude from Growth Chart --    No data found.  Updated Vital Signs BP (!) 153/97 (BP Location: Left Arm)   Pulse 88   Temp 98.3 F (36.8 C) (Oral)   Resp 16   LMP 08/29/2020   SpO2 98%   Visual Acuity Right Eye Distance:   Left Eye Distance:   Bilateral Distance:    Right Eye Near:   Left Eye Near:    Bilateral Near:     Physical Exam Constitutional:      Appearance: Normal appearance.  HENT:     Right Ear: A middle ear effusion is  present.     Left Ear: A middle ear effusion is present.     Nose:     Right Sinus: Maxillary sinus tenderness present.  Left Sinus: Maxillary sinus tenderness present.  Eyes:     General: Lids are normal.        Right eye: No discharge.        Left eye: No discharge.     Extraocular Movements: Extraocular movements intact.     Conjunctiva/sclera: Conjunctivae normal.  Cardiovascular:     Rate and Rhythm: Normal rate and regular rhythm.  Pulmonary:     Effort: Pulmonary effort is normal.     Breath sounds: Normal breath sounds.  Musculoskeletal:     Cervical back: Normal range of motion and neck supple. Tenderness present.  Lymphadenopathy:     Cervical: No cervical adenopathy.  Skin:    General: Skin is warm.  Neurological:     General: No focal deficit present.     Mental Status: She is alert.  Psychiatric:        Mood and Affect: Mood normal.      UC Treatments / Results  Labs (all labs ordered are listed, but only abnormal results are displayed) Labs Reviewed  SARS CORONAVIRUS 2 (TAT 6-24 HRS)    EKG   Radiology No results found.  Procedures Procedures (including critical care time)  Medications Ordered in UC Medications - No data to display  Initial Impression / Assessment and Plan / UC Course  I have reviewed the triage vital signs and the nursing notes.  Pertinent labs & imaging results that were available during my care of the patient were reviewed by me and considered in my medical decision making (see chart for details).    Final Clinical Impressions(s) / UC Diagnoses   Final diagnoses:  Encounter for screening for COVID-19  Viral upper respiratory illness     Discharge Instructions      You were seen today for upper respiratory symptoms.  You have been swabbed for covid and this will be resulted tomorrow.  If positive you may qualify for paxlovid.  In the mean time I have sent out a steroid to help with sinus and ear pressure an pain.   You may also take over the counter claritin/zyrtec to help with sinus congestion.  I recommend tylenol/motrin for pain and plenty of rest/fluids.  You may continue over the counter eye drops at this time as well.     ED Prescriptions     Medication Sig Dispense Auth. Provider   predniSONE (DELTASONE) 50 MG tablet Take 1 tablet (50 mg total) by mouth daily for 5 days. 5 tablet Jannifer Franklin, MD      PDMP not reviewed this encounter.   Jannifer Franklin, MD 09/06/23 1036

## 2023-09-06 NOTE — Discharge Instructions (Addendum)
You were seen today for upper respiratory symptoms.  You have been swabbed for covid and this will be resulted tomorrow.  If positive you may qualify for paxlovid.  In the mean time I have sent out a steroid to help with sinus and ear pressure an pain.  You may also take over the counter claritin/zyrtec to help with sinus congestion.  I recommend tylenol/motrin for pain and plenty of rest/fluids.  You may continue over the counter eye drops at this time as well.

## 2023-09-07 LAB — SARS CORONAVIRUS 2 (TAT 6-24 HRS): SARS Coronavirus 2: NEGATIVE

## 2023-10-12 ENCOUNTER — Encounter: Payer: Self-pay | Admitting: Orthopaedic Surgery

## 2023-10-12 ENCOUNTER — Other Ambulatory Visit (INDEPENDENT_AMBULATORY_CARE_PROVIDER_SITE_OTHER): Payer: 59

## 2023-10-12 ENCOUNTER — Ambulatory Visit (INDEPENDENT_AMBULATORY_CARE_PROVIDER_SITE_OTHER): Payer: 59 | Admitting: Orthopaedic Surgery

## 2023-10-12 DIAGNOSIS — M1712 Unilateral primary osteoarthritis, left knee: Secondary | ICD-10-CM

## 2023-10-12 DIAGNOSIS — M25562 Pain in left knee: Secondary | ICD-10-CM

## 2023-10-12 DIAGNOSIS — G8929 Other chronic pain: Secondary | ICD-10-CM

## 2023-10-12 DIAGNOSIS — M170 Bilateral primary osteoarthritis of knee: Secondary | ICD-10-CM

## 2023-10-12 DIAGNOSIS — M25561 Pain in right knee: Secondary | ICD-10-CM | POA: Diagnosis not present

## 2023-10-12 DIAGNOSIS — M1711 Unilateral primary osteoarthritis, right knee: Secondary | ICD-10-CM

## 2023-10-12 MED ORDER — BUPIVACAINE HCL 0.5 % IJ SOLN
2.0000 mL | INTRAMUSCULAR | Status: AC | PRN
Start: 2023-10-12 — End: 2023-10-12
  Administered 2023-10-12: 2 mL via INTRA_ARTICULAR

## 2023-10-12 MED ORDER — METHYLPREDNISOLONE ACETATE 40 MG/ML IJ SUSP
40.0000 mg | INTRAMUSCULAR | Status: AC | PRN
Start: 2023-10-12 — End: 2023-10-12
  Administered 2023-10-12: 40 mg via INTRA_ARTICULAR

## 2023-10-12 MED ORDER — LIDOCAINE HCL 1 % IJ SOLN
2.0000 mL | INTRAMUSCULAR | Status: AC | PRN
Start: 2023-10-12 — End: 2023-10-12
  Administered 2023-10-12: 2 mL

## 2023-10-12 NOTE — Progress Notes (Signed)
Office Visit Note   Patient: Stefanie Moon           Date of Birth: [birth year]           MRN: [id number] Visit Date: 10/12/2023              Requested by: Angelica Pou, NP [address] [place],  [place] [address] PCP: Angelica Pou, NP   Assessment & Plan: Visit Diagnoses:  1. Chronic pain of both knees   2. Primary osteoarthritis of left knee   3. Primary osteoarthritis of right knee     Plan: Stefanie Moon is a 52 year old female with bilateral knee osteoarthritis flare with effusion.  Both knees were aspirated and got about 15 cc from each knee.  Cortisone was administered.  Compression wrap applied.  Patient tolerated procedure well.  Follow-up as needed.  Follow-Up Instructions: No follow-ups on file.   Orders:  Orders Placed This Encounter  Procedures   XR KNEE 3 VIEW LEFT   XR KNEE 3 VIEW RIGHT   No orders of the defined types were placed in this encounter.     Procedures: Large Joint Inj: bilateral knee on 10/12/2023 10:59 AM Indications: pain Details: 22 G needle  Arthrogram: No  Medications (Right): 2 mL lidocaine 1 %; 2 mL bupivacaine 0.5 %; 40 mg methylPREDNISolone acetate 40 MG/ML Medications (Left): 2 mL lidocaine 1 %; 2 mL bupivacaine 0.5 %; 40 mg methylPREDNISolone acetate 40 MG/ML Outcome: tolerated well, no immediate complications Patient was prepped and draped in the usual sterile fashion.       Clinical Data: No additional findings.   Subjective: Chief Complaint  Patient presents with   Left Knee - Pain   Right Knee - Pain    HPI Stefanie Moon is a 52 year old female here for evaluation of bilateral knee pain that has recently gotten worse since she underwent gallbladder surgery about a month ago.  Denies any injuries.  States that she has swelling in her knees. Review of Systems  Constitutional: Negative.   HENT: Negative.    Eyes: Negative.   Respiratory: Negative.    Cardiovascular: Negative.   Endocrine:  Negative.   Musculoskeletal: Negative.   Neurological: Negative.   Hematological: Negative.   Psychiatric/Behavioral: Negative.    All other systems reviewed and are negative.    Objective: Vital Signs: LMP 08/29/2020   Physical Exam Vitals and nursing note reviewed.  Constitutional:      Appearance: She is well-developed.  HENT:     Head: Normocephalic and atraumatic.  Pulmonary:     Effort: Pulmonary effort is normal.  Abdominal:     Palpations: Abdomen is soft.  Musculoskeletal:     Cervical back: Neck supple.  Skin:    General: Skin is warm.     Capillary Refill: Capillary refill takes less than 2 seconds.  Neurological:     Mental Status: She is alert and oriented to person, place, and time.  Psychiatric:        Behavior: Behavior normal.        Thought Content: Thought content normal.        Judgment: Judgment normal.     Ortho Exam Exam of bilateral knees show joint effusions.  Medial joint tenderness.  Collaterals or cruciates are stable. Specialty Comments:  No specialty comments available.  Imaging: No results found.   PMFS History: Patient Active Problem List   Diagnosis Date Noted   Dysphagia 06/10/2022   Constipation 06/10/2022  Primary osteoarthritis of left knee 05/27/2022   Primary osteoarthritis of right knee 05/27/2022   Uterine leiomyoma 09/03/2020   S/P abdominal hysterectomy 09/03/2020   Past Medical History:  Diagnosis Date   Anemia    Anxiety    Arthritis    knee   Blood transfusion without reported diagnosis    Fibroids    GERD (gastroesophageal reflux disease)    Headache    Hypertension     Family History  Problem Relation Age of Onset   Colon polyps Mother    Hypertension Mother    Colon cancer Father    Liver disease Father    Hypertension Father    Colon polyps Sister    Heart disease Brother    Colon cancer Paternal Aunt    Kidney disease Maternal Grandmother    Hypertension Maternal Grandmother    Stomach  cancer Neg Hx    Esophageal cancer Neg Hx    Rectal cancer Neg Hx     Past Surgical History:  Procedure Laterality Date   APPENDECTOMY     CHOLECYSTECTOMY N/A 08/09/2023   Procedure: LAPAROSCOPIC CHOLECYSTECTOMY WITH ICG;  Surgeon: Gaynelle Adu, MD;  Location: [facility];  Service: General;  Laterality: N/A;   CYSTOSCOPY N/A 09/03/2020   Procedure: Bluford Kaufmann;  Surgeon: Lavina Hamman, MD;  Location: [facility];  Service: Gynecology;  Laterality: N/A;   HYSTERECTOMY ABDOMINAL WITH SALPINGECTOMY Bilateral 09/03/2020   Procedure: HYSTERECTOMY ABDOMINAL WITH SALPINGECTOMY;  Surgeon: Lavina Hamman, MD;  Location: [facility] OR;  Service: Gynecology;  Laterality: Bilateral;  TAP Block   KNEE ARTHROSCOPY Left 07/02/2021   Procedure: ARTHROSCOPY KNEE, LATERAL MENISECTOMY, CONDRAPLASTY, ASPIRATION OF BAKERS CYST;  Surgeon: Beverely Low, MD;  Location: [facility] OR;  Service: Orthopedics;  Laterality: Left;   Social History   Occupational History   Not on file  Tobacco Use   Smoking status: Never   Smokeless tobacco: Never  Vaping Use   Vaping status: Never Used  Substance and Sexual Activity   Alcohol use: Yes    Comment: social wine once a month   Drug use: No   Sexual activity: Yes    Birth control/protection: Pill

## 2023-10-21 NOTE — Plan of Care (Signed)
[facility] Tonsillectomy/Adenoidectomy, Postoperative PEDS care plan entered in error.

## 2024-01-02 ENCOUNTER — Emergency Department (HOSPITAL_BASED_OUTPATIENT_CLINIC_OR_DEPARTMENT_OTHER)
Admission: EM | Admit: 2024-01-02 | Discharge: 2024-01-02 | Discharge status: Against Medical Advice | Payer: Medicaid Other | Attending: Emergency Medicine | Admitting: Emergency Medicine

## 2024-01-02 ENCOUNTER — Other Ambulatory Visit: Payer: Self-pay

## 2024-01-02 ENCOUNTER — Encounter (HOSPITAL_BASED_OUTPATIENT_CLINIC_OR_DEPARTMENT_OTHER): Payer: Self-pay

## 2024-01-02 DIAGNOSIS — W448XXA Other foreign body entering into or through a natural orifice, initial encounter: Secondary | ICD-10-CM | POA: Insufficient documentation

## 2024-01-02 DIAGNOSIS — Z5321 Procedure and treatment not carried out due to patient leaving prior to being seen by health care provider: Secondary | ICD-10-CM | POA: Diagnosis not present

## 2024-01-02 DIAGNOSIS — T161XXA Foreign body in right ear, initial encounter: Secondary | ICD-10-CM | POA: Insufficient documentation

## 2024-01-02 MED ORDER — OXYCODONE-ACETAMINOPHEN 5-325 MG PO TABS
1.0000 | ORAL_TABLET | ORAL | Status: DC | PRN
  Administered 2024-01-02: 1 via ORAL
  Filled 2024-01-02: qty 1

## 2024-01-02 NOTE — ED Triage Notes (Signed)
 Q-tip in right ear for past 1-2 hours. Severe pain radiating to right cheek.

## 2024-01-02 NOTE — ED Triage Notes (Signed)
 Pt brought by EMS from home after cleaning ear with generic "Q-tip" when she felt a sharp pain that radiated through her cheek and then noticed the head of the Q-tip had broken off inside her ear. 8/10 pain at this time.

## 2024-01-14 ENCOUNTER — Emergency Department (HOSPITAL_COMMUNITY)
Admission: EM | Admit: 2024-01-14 | Discharge: 2024-01-14 | Discharge status: Against Medical Advice | Payer: Medicaid Other | Attending: Emergency Medicine | Admitting: Emergency Medicine

## 2024-01-14 ENCOUNTER — Other Ambulatory Visit: Payer: Self-pay

## 2024-01-14 ENCOUNTER — Encounter (HOSPITAL_COMMUNITY): Payer: Self-pay | Admitting: Emergency Medicine

## 2024-01-14 DIAGNOSIS — R109 Unspecified abdominal pain: Secondary | ICD-10-CM | POA: Diagnosis present

## 2024-01-14 DIAGNOSIS — Z5321 Procedure and treatment not carried out due to patient leaving prior to being seen by health care provider: Secondary | ICD-10-CM | POA: Insufficient documentation

## 2024-01-14 DIAGNOSIS — K5900 Constipation, unspecified: Secondary | ICD-10-CM | POA: Insufficient documentation

## 2024-01-14 NOTE — ED Triage Notes (Signed)
BIBA Per EMS: Pt coming from home w/ c/o abd pain & Cramping. Hx acid reflux. Constipated x 2 days. Denies N/V/D

## 2024-07-13 ENCOUNTER — Ambulatory Visit: Admitting: Family Medicine

## 2024-07-17 ENCOUNTER — Telehealth: Payer: Self-pay | Admitting: Gastroenterology

## 2024-07-17 NOTE — Telephone Encounter (Signed)
 PT is calling to speak with a nurse regarding her symptoms. She is severely bloated so bad her abdomen is very tight. She cannot even eat or drink water  without this happening and she is very concerned. Please advise.

## 2024-07-17 NOTE — Telephone Encounter (Signed)
 Spoke with the patient. She is on Linzess. She reports bowel movements every 3 days to every day. She feels bloated and is concerned she may have something terribly wrong. She was last seen in 2023. She feels 08/24/24 is too long to wait. Agrees to 08/01/24. She will go to the ER or an urgent care if she has severe or worsening abdominal pain.

## 2024-08-01 ENCOUNTER — Ambulatory Visit (INDEPENDENT_AMBULATORY_CARE_PROVIDER_SITE_OTHER): Admitting: Gastroenterology

## 2024-08-01 ENCOUNTER — Encounter: Payer: Self-pay | Admitting: Gastroenterology

## 2024-08-01 ENCOUNTER — Other Ambulatory Visit (INDEPENDENT_AMBULATORY_CARE_PROVIDER_SITE_OTHER)

## 2024-08-01 VITALS — BP 140/90 | HR 71 | Ht 64.0 in | Wt 212.0 lb

## 2024-08-01 DIAGNOSIS — R140 Abdominal distension (gaseous): Secondary | ICD-10-CM

## 2024-08-01 DIAGNOSIS — K439 Ventral hernia without obstruction or gangrene: Secondary | ICD-10-CM | POA: Diagnosis not present

## 2024-08-01 DIAGNOSIS — R110 Nausea: Secondary | ICD-10-CM

## 2024-08-01 DIAGNOSIS — R109 Unspecified abdominal pain: Secondary | ICD-10-CM | POA: Diagnosis not present

## 2024-08-01 DIAGNOSIS — K5909 Other constipation: Secondary | ICD-10-CM | POA: Diagnosis not present

## 2024-08-01 DIAGNOSIS — K429 Umbilical hernia without obstruction or gangrene: Secondary | ICD-10-CM

## 2024-08-01 LAB — CBC WITH DIFFERENTIAL/PLATELET
Basophils Absolute: 0.1 K/uL (ref 0.0–0.1)
Basophils Relative: 0.8 % (ref 0.0–3.0)
Eosinophils Absolute: 0.3 K/uL (ref 0.0–0.7)
Eosinophils Relative: 3.2 % (ref 0.0–5.0)
HCT: 42.4 % (ref 36.0–46.0)
Hemoglobin: 14.1 g/dL (ref 12.0–15.0)
Lymphocytes Relative: 25.4 % (ref 12.0–46.0)
Lymphs Abs: 2.2 K/uL (ref 0.7–4.0)
MCHC: 33.3 g/dL (ref 30.0–36.0)
MCV: 86.8 fl (ref 78.0–100.0)
Monocytes Absolute: 0.6 K/uL (ref 0.1–1.0)
Monocytes Relative: 7.3 % (ref 3.0–12.0)
Neutro Abs: 5.5 K/uL (ref 1.4–7.7)
Neutrophils Relative %: 63.3 % (ref 43.0–77.0)
Platelets: 227 K/uL (ref 150.0–400.0)
RBC: 4.89 Mil/uL (ref 3.87–5.11)
RDW: 13.5 % (ref 11.5–15.5)
WBC: 8.7 K/uL (ref 4.0–10.5)

## 2024-08-01 LAB — COMPREHENSIVE METABOLIC PANEL WITH GFR
ALT: 16 U/L (ref 0–35)
AST: 18 U/L (ref 0–37)
Albumin: 4.4 g/dL (ref 3.5–5.2)
Alkaline Phosphatase: 51 U/L (ref 39–117)
BUN: 11 mg/dL (ref 6–23)
CO2: 32 meq/L (ref 19–32)
Calcium: 9.3 mg/dL (ref 8.4–10.5)
Chloride: 99 meq/L (ref 96–112)
Creatinine, Ser: 1.06 mg/dL (ref 0.40–1.20)
GFR: 60.56 mL/min (ref >60.00)
Glucose, Bld: 99 mg/dL (ref 70–99)
Potassium: 3.8 meq/L (ref 3.5–5.1)
Sodium: 137 meq/L (ref 135–145)
Total Bilirubin: 0.8 mg/dL (ref 0.2–1.2)
Total Protein: 8 g/dL (ref 6.0–8.3)

## 2024-08-01 LAB — TSH: TSH: 0.76 u[IU]/mL (ref 0.35–5.50)

## 2024-08-01 NOTE — Progress Notes (Unsigned)
 Stefanie Moon [id number] 02-02-1971   Chief Complaint:  Referring Provider: Duwaine Spates, NP Primary GI MD: Dr. Legrand  HPI: Stefanie Moon is a 53 y.o. female with past medical history of iron deficiency anemia, GERD, HTN, arthritis, anxiety, uterine fibroids, partial hysterectomy, cholecystectomy who presents today for a complaint of abdominal pain and constipation.    Patient last seen in office 06/10/2022 by Elida Shawl, NP to schedule screening colonoscopy.  Father with history of colon cancer, sister with history of colon polyps.  Also reported GERD and dysphagia at that time and was scheduled for EGD.  Advised to take MiraLAX as needed for constipation and increase water  intake.  History of IDA secondary to menorrhagia/uterine fibroids, with history of oral iron supplementation.  She underwent EGD 07/2022 with mild dilation of the upper third of the esophagus and middle third of the esophagus with diminished motility.  Otherwise normal.  She had a normal barium esophagram 07/23/2023.  Found to have 2 tubular adenomas on colonoscopy 01/2023, otherwise normal.   Patient called 07/17/2024 reporting bloating, with a bowel movement every 3 days to every day on Linzess.  Has not been seen in office since 2023.  Advised to go to the ER for severe or worsening abdominal pain.   Put on linzess by PCP, on 290 mcg Has been fasting, drinks water  has bloating Standing up and walking helps Any amount of food causes bloating Taking a lot of gasx Ongoing for months Has history of cholecystectomy Has ventral hernia Had diverticulitis in January? Seen in ED, review chart Not seeing records of this  Over the last year or so constipation problems Linzess works fine some days, can have loose stools 5/7 days a week has a bm First time is normal, followed by a couple loose or watery stools, does feel relieved most of the time  She is on percocet for knee pain, not taking it  every Keeps another laxative on hand (milk of mag)      Previous GI Procedures/Imaging   Colonoscopy 02/11/2023 - One 8 mm polyp in the proximal transverse colon, removed with a cold snare and removed piecemeal using a cold snare. Resected and retrieved.  - One 8 mm polyp in the sigmoid colon, removed with a hot snare. Resected and retrieved.  - The examination was otherwise normal on direct and retroflexion views. - Recall 5 years Path: Surgical [P], colon, transverse and sigmoid, polyp (2) - TUBULAR ADENOMA, FRAGMENTS.  CT A/P 01/15/2023 1. Descending and sigmoid colonic diverticulosis without CT evidence for acute diverticulitis. 2. Complex fat containing ventral abdominal wall hernia with mild stranding. 3. Cholelithiasis.  Probable stone in the gallbladder neck.  EGD 08/07/2022 - Dilation in the upper third of the esophagus and in the middle third of the esophagus.  - Normal stomach.  - Normal examined duodenum.  - No specimens collected.  Past Medical History:  Diagnosis Date   Anemia    Anxiety    Arthritis    knee   Blood transfusion without reported diagnosis    Fibroids    GERD (gastroesophageal reflux disease)    Headache    Hypertension     Past Surgical History:  Procedure Laterality Date   APPENDECTOMY     CHOLECYSTECTOMY N/A 08/09/2023   Procedure: LAPAROSCOPIC CHOLECYSTECTOMY WITH ICG;  Surgeon: Tanda Locus, MD;  Location: [facility];  Service: General;  Laterality: N/A;   CYSTOSCOPY N/A 09/03/2020   Procedure: PHYLLIS;  Surgeon: Horacio Boas, MD;  Location: [facility] OR;  Service: Gynecology;  Laterality: N/A;   HYSTERECTOMY ABDOMINAL WITH SALPINGECTOMY Bilateral 09/03/2020   Procedure: HYSTERECTOMY ABDOMINAL WITH SALPINGECTOMY;  Surgeon: Horacio Boas, MD;  Location: [facility] OR;  Service: Gynecology;  Laterality: Bilateral;  TAP Block   KNEE ARTHROSCOPY Left 07/02/2021   Procedure: ARTHROSCOPY KNEE, LATERAL MENISECTOMY, CONDRAPLASTY, ASPIRATION OF BAKERS CYST;   Surgeon: Kay Kemps, MD;  Location: [facility] OR;  Service: Orthopedics;  Laterality: Left;    Current Outpatient Medications  Medication Sig Dispense Refill   amLODipine  (NORVASC ) 10 MG tablet Take 1 tablet (10 mg total) by mouth daily. 90 tablet 1   ciprofloxacin  (CILOXAN ) 0.3 % ophthalmic ointment Place into the right eye 2 (two) times daily. 3.5 g 0   ergocalciferol (VITAMIN D2) 1.25 MG (50000 UT) capsule Take 50,000 Units by mouth once a week.     famotidine  (PEPCID ) 20 MG tablet Take 1 tablet (20 mg total) by mouth at bedtime. 30 tablet 2   fluticasone  (FLONASE ) 50 MCG/ACT nasal spray Place 2 sprays into both nostrils daily. 16 g 6   naproxen  (NAPROSYN ) 375 MG tablet Take 1 tablet (375 mg total) by mouth 2 (two) times daily. 20 tablet 0   ondansetron  (ZOFRAN ) 4 MG tablet Take 1 tablet (4 mg total) by mouth as directed. Take one Zofran  4 mg tablet 30-60 minutes before each colonoscopy prep dose 2 tablet 0   oxyCODONE -acetaminophen  (PERCOCET/ROXICET) 5-325 MG tablet Take 1 tablet by mouth every 6 (six) hours as needed for moderate pain.     pantoprazole  (PROTONIX ) 40 MG tablet Take 1 tablet (40 mg total) by mouth daily. 30 tablet 3   No current facility-administered medications for this visit.    Allergies as of 08/01/2024 - Review Complete 08/01/2024  Allergen Reaction Noted   Tramadol  Hives, Rash, and Itching 06/23/2018    Family History  Problem Relation Age of Onset   Colon polyps Mother    Hypertension Mother    Colon cancer Father    Liver disease Father    Hypertension Father    Colon polyps Sister    Heart disease Brother    Colon cancer Paternal Aunt    Kidney disease Maternal Grandmother    Hypertension Maternal Grandmother    Stomach cancer Neg Hx    Esophageal cancer Neg Hx    Rectal cancer Neg Hx     Social History   Tobacco Use   Smoking status: Never   Smokeless tobacco: Never  Vaping Use   Vaping status: Never Used  Substance Use Topics   Alcohol use:  Yes    Comment: social wine once a month   Drug use: No     Review of Systems:    Constitutional: No weight loss, fever, chills, weakness or fatigue Eyes: No change in vision Ears, Nose, Throat:  No change in hearing or congestion Skin: No rash or itching Cardiovascular: No chest pain, chest pressure or palpitations   Respiratory: No SOB or cough Gastrointestinal: See HPI and otherwise negative Genitourinary: No dysuria or change in urinary frequency Neurological: No headache, dizziness or syncope Musculoskeletal: No new muscle or joint pain Hematologic: No bleeding or bruising    Physical Exam:  Vital signs: BP (!) 140/90   Pulse 71   Ht 5' 4 (1.626 m)   Wt 212 lb (96.2 kg)   LMP 08/29/2020   BMI 36.39 kg/m   Constitutional: NAD, Well developed, Well nourished, alert and cooperative Head:  Normocephalic and atraumatic.  Eyes: No  scleral icterus. Conjunctiva pink. Mouth: No oral lesions. Respiratory: Respirations even and unlabored. Lungs clear to auscultation bilaterally.  No wheezes, crackles, or rhonchi.  Cardiovascular:  Regular rate and rhythm. No murmurs. No peripheral edema. Gastrointestinal:  Soft, nondistended, nontender. No rebound or guarding. Normal bowel sounds. No appreciable masses or hepatomegaly. Rectal:  Not performed.  Neurologic:  Alert and oriented x4;  grossly normal neurologically.  Skin:   Dry and intact without significant lesions or rashes. Psychiatric: Oriented to person, place and time. Demonstrates good judgement and reason without abnormal affect or behaviors.   RELEVANT LABS AND IMAGING: CBC    Component Value Date/Time   WBC 7.3 07/28/2023 1110   RBC 4.72 07/28/2023 1110   HGB 13.6 07/28/2023 1110   HCT 41.7 07/28/2023 1110   PLT 238 07/28/2023 1110   MCV 88.3 07/28/2023 1110   MCH 28.8 07/28/2023 1110   MCHC 32.6 07/28/2023 1110   RDW 12.8 07/28/2023 1110   LYMPHSABS 2.0 01/15/2023 0904   MONOABS 0.6 01/15/2023 0904   EOSABS  0.4 01/15/2023 0904   BASOSABS 0.0 01/15/2023 0904    CMP     Component Value Date/Time   NA 137 07/28/2023 1110   K 3.8 07/28/2023 1110   CL 103 07/28/2023 1110   CO2 24 07/28/2023 1110   GLUCOSE 99 07/28/2023 1110   BUN 13 07/28/2023 1110   CREATININE 1.02 (H) 07/28/2023 1110   CALCIUM 9.1 07/28/2023 1110   PROT 7.8 07/28/2023 1110   ALBUMIN  3.8 07/28/2023 1110   AST 18 07/28/2023 1110   ALT 17 07/28/2023 1110   ALKPHOS 57 07/28/2023 1110   BILITOT 0.7 07/28/2023 1110   GFRNONAA >60 07/28/2023 1110   GFRAA >60 08/28/2020 1215     Assessment/Plan:   Abdominal CT CBC, CMP, TSH Has seen surgeon post chole, was discussed to have hernia surgery    Camie Furbish, PA-C [facility] 08/01/2024, 2:52 PM  Patient Care Team: Duwaine Spates, NP as PCP - General

## 2024-08-01 NOTE — Patient Instructions (Signed)
 Your provider has requested that you go to the basement level for lab work before leaving today. Press B on the elevator. The lab is located at the first door on the left as you exit the elevator.  You have been scheduled for a CT scan of the abdomen and pelvis at [facility], 1st floor Radiology. You are scheduled on 08/04/24 at 4:00 pm . You should arrive 2 hours prior to at 1:45 pm to your appointment time for registration and to drink oral contrast.   Please follow the written instructions below on the day of your exam:   1) Do not eat anything after 12:00 noon  (4 hours prior to your test)    You may take any medications as prescribed with a small amount of water , if necessary. If you take any of the following medications: METFORMIN, GLUCOPHAGE, GLUCOVANCE, AVANDAMET, RIOMET, FORTAMET, ACTOPLUS MET, JANUMET, GLUMETZA or METAGLIP, you MAY be asked to HOLD this medication 48 hours AFTER the exam.   The purpose of you drinking the oral contrast is to aid in the visualization of your intestinal tract. The contrast solution may cause some diarrhea. Depending on your individual set of symptoms, you may also receive an intravenous injection of x-ray contrast/dye. Plan on being at [facility] for 45 minutes or longer, depending on the type of exam you are having performed.   If you have any questions regarding your exam or if you need to reschedule, you may call [facility] at [phone] between the hours of 8:00 am and 5:00 pm, Monday-Friday.   Due to recent changes in healthcare laws, you may see the results of your imaging and laboratory studies on MyChart before your provider has had a chance to review them.  We understand that in some cases there may be results that are confusing or concerning to you. Not all laboratory results come back in the same time frame and the provider may be waiting for multiple results in order to interpret others.  Please give us  48 hours in order for  your provider to thoroughly review all the results before contacting the office for clarification of your results.   _______________________________________________________  If your blood pressure at your visit was 140/90 or greater, please contact your primary care physician to follow up on this.  _______________________________________________________  If you are age 52 or older, your body mass index should be between 23-30. Your Body mass index is 36.39 kg/m. If this is out of the aforementioned range listed, please consider follow up with your Primary Care Provider.  If you are age 52 or younger, your body mass index should be between 19-25. Your Body mass index is 36.39 kg/m. If this is out of the aformentioned range listed, please consider follow up with your Primary Care Provider.   ________________________________________________________  The [facility] providers would like to encourage you to use MYCHART to communicate with providers for non-urgent requests or questions.  Due to long hold times on the telephone, sending your provider a message by [facility] may be a faster and more efficient way to get a response.  Please allow 48 business hours for a response.  Please remember that this is for non-urgent requests.  _______________________________________________________  [facility] is using a team-based approach to care.  Your team is made up of your doctor and two to three APPS. Our APPS (Nurse Practitioners and Physician Assistants) work with your physician to ensure care continuity for you. They are fully qualified  to address your health concerns and develop a treatment plan. They communicate directly with your gastroenterologist to care for you. Seeing the Advanced Practice Practitioners on your physician's team can help you by facilitating care more promptly, often allowing for earlier appointments, access to diagnostic testing, procedures, and other specialty referrals.    Thank you for choosing me and [facility].  Camie Furbish, PA-C

## 2024-08-02 ENCOUNTER — Ambulatory Visit: Payer: Self-pay | Admitting: Gastroenterology

## 2024-08-02 ENCOUNTER — Telehealth: Payer: Self-pay | Admitting: Gastroenterology

## 2024-08-02 ENCOUNTER — Encounter: Payer: Self-pay | Admitting: Gastroenterology

## 2024-08-02 NOTE — Telephone Encounter (Signed)
 Hello, just a heads up this pt's CT scan went to further clinical review through [facility]. The case is pending and I have faxed over the clinical information, I will check on the status but there is no turn around time for a decision.

## 2024-08-02 NOTE — Progress Notes (Signed)
 ____________________________________________________________  Attending physician addendum:  Thank you for sending this case to me. I have reviewed the entire note and agree with the plan.   Amada Jupiter, MD  ____________________________________________________________

## 2024-08-04 ENCOUNTER — Ambulatory Visit (HOSPITAL_COMMUNITY)

## 2024-08-08 NOTE — Telephone Encounter (Signed)
 Stefanie Moon this patient needs a peer to peer. Her CT scan was cancelled. Precert Team has attached information that is needed to obtain peer to peer.

## 2024-08-11 NOTE — Telephone Encounter (Signed)
 Records faxed.

## 2024-08-14 ENCOUNTER — Telehealth: Payer: Self-pay | Admitting: Gastroenterology

## 2024-08-14 DIAGNOSIS — R110 Nausea: Secondary | ICD-10-CM

## 2024-08-14 DIAGNOSIS — K5909 Other constipation: Secondary | ICD-10-CM

## 2024-08-14 DIAGNOSIS — R140 Abdominal distension (gaseous): Secondary | ICD-10-CM

## 2024-08-14 DIAGNOSIS — R109 Unspecified abdominal pain: Secondary | ICD-10-CM

## 2024-08-14 DIAGNOSIS — K439 Ventral hernia without obstruction or gangrene: Secondary | ICD-10-CM

## 2024-08-14 NOTE — Telephone Encounter (Signed)
 Again attempted peer to peer to get CT A/P covered.  Insurance is going to require updated abdominal imaging with ultrasound prior to approval of CT scan.  Please order complete abdominal ultrasound for constipation, bloating, abdominal pain, infraumbilical hernia, nausea. Would again give ED precautions for severe or worsening pain, vomiting, inability to pass gas or stool, fever, etc.  Thank you

## 2024-08-15 NOTE — Telephone Encounter (Signed)
 Order for complete abdominal ultrasound has been placed. Patient will be contacted by [facility] to schedule. Detailed message has been left for the patient.

## 2024-08-21 ENCOUNTER — Ambulatory Visit (HOSPITAL_COMMUNITY): Admission: RE | Admit: 2024-08-21 | Source: Ambulatory Visit

## 2024-08-22 ENCOUNTER — Ambulatory Visit (HOSPITAL_COMMUNITY)
Admission: RE | Admit: 2024-08-22 | Discharge: 2024-08-22 | Disposition: A | Discharge status: Home / Self Care | Source: Ambulatory Visit | Attending: Gastroenterology | Admitting: Gastroenterology

## 2024-08-22 DIAGNOSIS — R110 Nausea: Secondary | ICD-10-CM | POA: Insufficient documentation

## 2024-08-22 DIAGNOSIS — K439 Ventral hernia without obstruction or gangrene: Secondary | ICD-10-CM | POA: Diagnosis present

## 2024-08-22 DIAGNOSIS — R109 Unspecified abdominal pain: Secondary | ICD-10-CM | POA: Insufficient documentation

## 2024-08-22 DIAGNOSIS — R140 Abdominal distension (gaseous): Secondary | ICD-10-CM | POA: Insufficient documentation

## 2024-08-22 DIAGNOSIS — K5909 Other constipation: Secondary | ICD-10-CM | POA: Diagnosis present

## 2024-08-24 ENCOUNTER — Ambulatory Visit: Admitting: Physician Assistant

## 2024-08-26 ENCOUNTER — Ambulatory Visit: Payer: Self-pay | Admitting: Gastroenterology

## 2024-08-29 NOTE — Progress Notes (Signed)
 The pt has been advised and message sent to the schedulers

## 2024-08-29 NOTE — Progress Notes (Signed)
 Left message on machine to call back

## 2024-09-15 ENCOUNTER — Ambulatory Visit: Admitting: Gastroenterology

## 2024-09-15 NOTE — Progress Notes (Deleted)
 Stefanie Moon [id number] [birth year]   Chief Complaint:  Referring Provider: Duwaine Spates, NP Primary GI MD: Dr. Legrand  HPI: Stefanie Moon is a 53 y.o. female with past medical history of iron deficiency anemia, GERD, HTN, arthritis, anxiety, uterine fibroids, partial hysterectomy, cholecystectomy 07/2023 who presents today for a complaint of *** .    Seen in office 06/10/2022 by Elida Shawl, NP to schedule screening colonoscopy.  Father with history of colon cancer, sister with history of colon polyps.  Also reported GERD and dysphagia at that time and was scheduled for EGD.  Advised to take MiraLAX as needed for constipation and increase water  intake.  History of IDA secondary to menorrhagia/uterine fibroids, with history of oral iron supplementation.   She underwent EGD 07/2022 with mild dilation of the upper third of the esophagus and middle third of the esophagus with diminished motility.  Otherwise normal.   She had a normal barium esophagram 07/23/2023.   Found to have 2 tubular adenomas on colonoscopy 01/2023, otherwise normal.    Patient called 07/17/2024 reporting bloating, with a bowel movement every 3 days to every day on Linzess.  Has not been seen in office since 2023.  Advised to go to the ER for severe or worsening abdominal pain.  Patient last seen in office 08/01/2024 at which time she reported constipation, persistent abdominal bloating, and abdominal pain.  Bloating occurs while fasting, after drinking water , or after eating any amount of food.  Symptoms ongoing for months and worsening.  Was having a bowel movement a few times a week on Linzess 290 mcg with some relief.  She was tender to palpation of lower abdomen.  S/p cholecystectomy 07/2023 and at follow-up 08/2023, noted to have infraumbilical hernia not reducible and was advised to follow-up with them in a few weeks to discuss possible hernia repair, but had not had follow-up at that point.  CT was  ordered but was denied by insurance without ultrasound prior.  Labs were normal.  No significant findings on abdominal ultrasound.  She was advised to follow-up with surgery to revisit hernia repair.    Previous GI Procedures/Imaging   Abdominal US  08/22/2024 No significant sonographic abnormality of the abdomen.   Colonoscopy 02/11/2023 - One 8 mm polyp in the proximal transverse colon, removed with a cold snare and removed piecemeal using a cold snare. Resected and retrieved.  - One 8 mm polyp in the sigmoid colon, removed with a hot snare. Resected and retrieved.  - The examination was otherwise normal on direct and retroflexion views. - Recall 5 years Path: Surgical [P], colon, transverse and sigmoid, polyp (2) - TUBULAR ADENOMA, FRAGMENTS.   CT A/P 01/15/2023 1. Descending and sigmoid colonic diverticulosis without CT evidence for acute diverticulitis. 2. Complex fat containing ventral abdominal wall hernia with mild stranding. 3. Cholelithiasis.  Probable stone in the gallbladder neck.   EGD 08/07/2022 - Dilation in the upper third of the esophagus and in the middle third of the esophagus.  - Normal stomach.  - Normal examined duodenum.  - No specimens collected.   Past Medical History:  Diagnosis Date   Anemia    Anxiety    Arthritis    knee   Blood transfusion without reported diagnosis    Fibroids    GERD (gastroesophageal reflux disease)    Headache    Hypertension     Past Surgical History:  Procedure Laterality Date   APPENDECTOMY     CHOLECYSTECTOMY N/A 08/09/2023  Procedure: LAPAROSCOPIC CHOLECYSTECTOMY WITH ICG;  Surgeon: Tanda Locus, MD;  Location: [facility];  Service: General;  Laterality: N/A;   CYSTOSCOPY N/A 09/03/2020   Procedure: PHYLLIS;  Surgeon: Horacio Boas, MD;  Location: [facility] OR;  Service: Gynecology;  Laterality: N/A;   HYSTERECTOMY ABDOMINAL WITH SALPINGECTOMY Bilateral 09/03/2020   Procedure: HYSTERECTOMY ABDOMINAL WITH SALPINGECTOMY;   Surgeon: Horacio Boas, MD;  Location: [facility] OR;  Service: Gynecology;  Laterality: Bilateral;  TAP Block   KNEE ARTHROSCOPY Left 07/02/2021   Procedure: ARTHROSCOPY KNEE, LATERAL MENISECTOMY, CONDRAPLASTY, ASPIRATION OF BAKERS CYST;  Surgeon: Kay Kemps, MD;  Location: [facility] OR;  Service: Orthopedics;  Laterality: Left;    Current Outpatient Medications  Medication Sig Dispense Refill   amLODipine  (NORVASC ) 10 MG tablet Take 1 tablet (10 mg total) by mouth daily. 90 tablet 1   ciprofloxacin  (CILOXAN ) 0.3 % ophthalmic ointment Place into the right eye 2 (two) times daily. 3.5 g 0   ergocalciferol (VITAMIN D2) 1.25 MG (50000 UT) capsule Take 50,000 Units by mouth once a week.     famotidine  (PEPCID ) 20 MG tablet Take 1 tablet (20 mg total) by mouth at bedtime. 30 tablet 2   fluticasone  (FLONASE ) 50 MCG/ACT nasal spray Place 2 sprays into both nostrils daily. 16 g 6   naproxen  (NAPROSYN ) 375 MG tablet Take 1 tablet (375 mg total) by mouth 2 (two) times daily. 20 tablet 0   ondansetron  (ZOFRAN ) 4 MG tablet Take 1 tablet (4 mg total) by mouth as directed. Take one Zofran  4 mg tablet 30-60 minutes before each colonoscopy prep dose 2 tablet 0   oxyCODONE -acetaminophen  (PERCOCET/ROXICET) 5-325 MG tablet Take 1 tablet by mouth every 6 (six) hours as needed for moderate pain.     pantoprazole  (PROTONIX ) 40 MG tablet Take 1 tablet (40 mg total) by mouth daily. 30 tablet 3   No current facility-administered medications for this visit.    Allergies as of 09/15/2024 - Review Complete 08/02/2024  Allergen Reaction Noted   Tramadol  Hives, Rash, and Itching 06/23/2018    Family History  Problem Relation Age of Onset   Colon polyps Mother    Hypertension Mother    Colon cancer Father    Liver disease Father    Hypertension Father    Colon polyps Sister    Heart disease Brother    Colon cancer Paternal Aunt    Kidney disease Maternal Grandmother    Hypertension Maternal Grandmother    Stomach cancer  Neg Hx    Esophageal cancer Neg Hx    Rectal cancer Neg Hx     Social History   Tobacco Use   Smoking status: Never   Smokeless tobacco: Never  Vaping Use   Vaping status: Never Used  Substance Use Topics   Alcohol use: Yes    Comment: social wine once a month   Drug use: No     Review of Systems:    Constitutional: No weight loss, fever, chills, weakness or fatigue Eyes: No change in vision Ears, Nose, Throat:  No change in hearing or congestion Skin: No rash or itching Cardiovascular: No chest pain, chest pressure or palpitations   Respiratory: No SOB or cough Gastrointestinal: See HPI and otherwise negative Genitourinary: No dysuria or change in urinary frequency Neurological: No headache, dizziness or syncope Musculoskeletal: No new muscle or joint pain Hematologic: No bleeding or bruising    Physical Exam:  Vital signs: LMP 08/29/2020   Constitutional: NAD, Well developed, Well nourished, alert and cooperative Head:  Normocephalic and atraumatic.  Eyes: No scleral icterus. Conjunctiva pink. Mouth: No oral lesions. Respiratory: Respirations even and unlabored. Lungs clear to auscultation bilaterally.  No wheezes, crackles, or rhonchi.  Cardiovascular:  Regular rate and rhythm. No murmurs. No peripheral edema. Gastrointestinal:  Soft, nondistended, nontender. No rebound or guarding. Normal bowel sounds. No appreciable masses or hepatomegaly. Rectal:  Not performed.  Neurologic:  Alert and oriented x4;  grossly normal neurologically.  Skin:   Dry and intact without significant lesions or rashes. Psychiatric: Oriented to person, place and time. Demonstrates good judgement and reason without abnormal affect or behaviors.   RELEVANT LABS AND IMAGING: CBC    Component Value Date/Time   WBC 8.7 08/01/2024 1527   RBC 4.89 08/01/2024 1527   HGB 14.1 08/01/2024 1527   HCT 42.4 08/01/2024 1527   PLT 227.0 08/01/2024 1527   MCV 86.8 08/01/2024 1527   MCH 28.8  07/28/2023 1110   MCHC 33.3 08/01/2024 1527   RDW 13.5 08/01/2024 1527   LYMPHSABS 2.2 08/01/2024 1527   MONOABS 0.6 08/01/2024 1527   EOSABS 0.3 08/01/2024 1527   BASOSABS 0.1 08/01/2024 1527    CMP     Component Value Date/Time   NA 137 08/01/2024 1527   K 3.8 08/01/2024 1527   CL 99 08/01/2024 1527   CO2 32 08/01/2024 1527   GLUCOSE 99 08/01/2024 1527   BUN 11 08/01/2024 1527   CREATININE 1.06 08/01/2024 1527   CALCIUM 9.3 08/01/2024 1527   PROT 8.0 08/01/2024 1527   ALBUMIN  4.4 08/01/2024 1527   AST 18 08/01/2024 1527   ALT 16 08/01/2024 1527   ALKPHOS 51 08/01/2024 1527   BILITOT 0.8 08/01/2024 1527   GFRNONAA >60 07/28/2023 1110   GFRAA >60 08/28/2020 1215     Assessment/Plan:    Can attempt CT again Recommend follow-up with surgery regarding hernia   Camie Furbish, PA-C [facility] 09/15/2024, 11:49 AM  Patient Care Team: Duwaine Spates, NP as PCP - General

## 2024-11-21 ENCOUNTER — Other Ambulatory Visit: Payer: Self-pay

## 2024-11-21 ENCOUNTER — Emergency Department (HOSPITAL_COMMUNITY)
Admission: EM | Admit: 2024-11-21 | Discharge: 2024-11-21 | Disposition: A | Discharge status: Home / Self Care | Attending: Emergency Medicine | Admitting: Emergency Medicine

## 2024-11-21 DIAGNOSIS — M5442 Lumbago with sciatica, left side: Secondary | ICD-10-CM | POA: Diagnosis not present

## 2024-11-21 DIAGNOSIS — M545 Low back pain, unspecified: Secondary | ICD-10-CM | POA: Diagnosis present

## 2024-11-21 MED ORDER — LIDOCAINE 5 % EX PTCH
1.0000 | MEDICATED_PATCH | CUTANEOUS | Status: DC
  Administered 2024-11-21: 1 via TRANSDERMAL
  Filled 2024-11-21: qty 1

## 2024-11-21 MED ORDER — LIDOCAINE 5 % EX PTCH: 1.0000 | MEDICATED_PATCH | CUTANEOUS | 0 refills | Status: AC

## 2024-11-21 MED ORDER — METHYLPREDNISOLONE 4 MG PO TBPK
ORAL_TABLET | ORAL | 0 refills | Status: AC
Start: 2024-11-21 — End: ?

## 2024-11-21 MED ORDER — DEXAMETHASONE SOD PHOSPHATE PF 10 MG/ML IJ SOLN
10.0000 mg | Freq: Once | INTRAMUSCULAR | Status: AC
  Administered 2024-11-21: 10 mg via INTRAMUSCULAR

## 2024-11-21 MED ORDER — CYCLOBENZAPRINE HCL 10 MG PO TABS: 10.0000 mg | ORAL_TABLET | Freq: Two times a day (BID) | ORAL | 0 refills | Status: AC | PRN

## 2024-11-21 NOTE — ED Triage Notes (Signed)
 Pt ambulatory to triage with complaints of a pulling pain in my LEFT arm, that moved into the LEFT hip and buttock. PT reports that her pain is alleviated when her leg is lifted. Pt states no relief with Icy hot.

## 2024-11-21 NOTE — ED Provider Notes (Signed)
 [place] EMERGENCY DEPARTMENT AT [facility] Provider Note   CSN: [id number] Arrival date & time: 11/21/24  9275     Patient presents with: Back Pain   Stefanie Moon is a 53 y.o. female.   Patient here left lower back pain shooting down into the left thigh left knee at times.  Denies any weakness numbness tingling.  No fever no chills.  History of fibroids.  Does not have any arm pain chest pain shortness of breath weakness numbness tingling otherwise.  Is not endorsing any neck pain headache fever chills.  No trauma.  No falls.  The history is provided by the patient.       Prior to Admission medications   Medication Sig Start Date End Date Taking? Authorizing Provider  cyclobenzaprine  (FLEXERIL ) 10 MG tablet Take 1 tablet (10 mg total) by mouth 2 (two) times daily as needed for muscle spasms. 11/21/24  Yes Stefanie Whitmire, DO  lidocaine  (LIDODERM ) 5 % Place 1 patch onto the skin daily. Remove & Discard patch within 12 hours or as directed by MD 11/21/24  Yes Stefanie Sawtell, DO  methylPREDNISolone  (MEDROL  DOSEPAK) 4 MG TBPK tablet Follow package insert 11/21/24  Yes Stefanie Hunkele, DO  amLODipine  (NORVASC ) 10 MG tablet Take 1 tablet (10 mg total) by mouth daily. 03/31/22   Stefanie Rosaline SQUIBB, NP  ciprofloxacin  (CILOXAN ) 0.3 % ophthalmic ointment Place into the right eye 2 (two) times daily. 05/13/22   Stefanie Rosaline SQUIBB, NP  ergocalciferol (VITAMIN D2) 1.25 MG (50000 UT) capsule Take 50,000 Units by mouth once a week.    [provider]  famotidine  (PEPCID ) 20 MG tablet Take 1 tablet (20 mg total) by mouth at bedtime. 06/10/22   Kennedy-Smith, Colleen M, NP  fluticasone  (FLONASE ) 50 MCG/ACT nasal spray Place 2 sprays into both nostrils daily. 05/13/22   Stefanie Rosaline SQUIBB, NP  naproxen  (NAPROSYN ) 375 MG tablet Take 1 tablet (375 mg total) by mouth 2 (two) times daily. 01/15/23   Stefanie Simmonds, MD  ondansetron  (ZOFRAN ) 4 MG tablet Take 1 tablet (4 mg total) by  mouth as directed. Take one Zofran  4 mg tablet 30-60 minutes before each colonoscopy prep dose 01/29/23   Stefanie Victory LITTIE DOUGLAS, MD  oxyCODONE -acetaminophen  (PERCOCET/ROXICET) 5-325 MG tablet Take 1 tablet by mouth every 6 (six) hours as needed for moderate pain. 06/24/23   [provider]  pantoprazole  (PROTONIX ) 40 MG tablet Take 1 tablet (40 mg total) by mouth daily. 03/31/22   Stefanie Rosaline SQUIBB, NP  Esomeprazole Magnesium  (NEXIUM PO) Take by mouth daily.  02/01/21  [provider]  lisinopril  (ZESTRIL ) 20 MG tablet Take 20 mg by mouth daily.  02/01/21  [provider]    Allergies: Tramadol     Review of Systems  Updated Vital Signs BP (!) 147/96 (BP Location: Left Arm)   Pulse 80   Temp 98.2 F (36.8 C) (Oral)   Resp 18   LMP 08/29/2020   SpO2 94%   Physical Exam Vitals and nursing note reviewed.  Constitutional:      General: She is not in acute distress.    Appearance: She is well-developed. She is not ill-appearing.  HENT:     Head: Normocephalic and atraumatic.     Nose: Nose normal.     Mouth/Throat:     Mouth: Mucous membranes are dry.  Eyes:     Extraocular Movements: Extraocular movements intact.     Conjunctiva/sclera: Conjunctivae normal.     Pupils: Pupils  are equal, round, and reactive to light.  Cardiovascular:     Rate and Rhythm: Normal rate and regular rhythm.     Pulses: Normal pulses.     Heart sounds: Normal heart sounds. No murmur heard. Pulmonary:     Effort: Pulmonary effort is normal. No respiratory distress.     Breath sounds: Normal breath sounds.  Abdominal:     General: Abdomen is flat.     Palpations: Abdomen is soft.     Tenderness: There is no abdominal tenderness.  Musculoskeletal:        General: Tenderness present. No swelling. Normal range of motion.     Cervical back: Normal range of motion and neck supple.     Comments: Tenderness to the paraspinal lumbar muscles on the left tenderness to the left gluteal muscles   Skin:    General: Skin is warm and dry.     Capillary Refill: Capillary refill takes less than 2 seconds.  Neurological:     General: No focal deficit present.     Mental Status: She is alert and oriented to person, place, and time.     Cranial Nerves: No cranial nerve deficit.     Sensory: No sensory deficit.     Motor: No weakness.     Coordination: Coordination normal.     Comments: 5+ out of 5 strength throughout, normal sensation, slightly antalgic gait  Psychiatric:        Mood and Affect: Mood normal.     (all labs ordered are listed, but only abnormal results are displayed) Labs Reviewed - No data to display  EKG: None  Radiology: No results found.   Procedures   Medications Ordered in the ED  lidocaine  (LIDODERM ) 5 % 1 patch (1 patch Transdermal Patch Applied 11/21/24 0756)  dexamethasone  (DECADRON ) injection 10 mg (10 mg Intramuscular Given 11/21/24 0755)                                    Medical Decision Making Risk Prescription drug management.   Stefanie Moon is here with left lower back pain rating down into her left thigh and knee at times.  Denies any trauma or falls.  Denies any loss of bowel or bladder.  Denies any fever or chills.  Despite triage note she is not really complaining of any left arm pain.  Differential diagnosis likely sciatica/muscle spasm.  She has no red flag symptoms to suggest cauda equina or infectious process.  She has got normal strength and sensation on exam.  She got normal pulses on exam.  Have no concern for spinal cord issue or vascular issue.  Overall we will give her IM Decadron  lidocaine  patch and prescribe Medrol  Dosepak lidocaine  patch and Tylenol  for home.  Will also give her Flexeril .  Educated about the use of these medications.  Recommend close follow-up with primary care doctor.  Have no concern for other acute process at this time.  This chart was dictated using voice recognition software.  Despite best  efforts to proofread,  errors can occur which can change the documentation meaning.      Final diagnoses:  Acute left-sided low back pain with left-sided sciatica    ED Discharge Orders          Ordered    methylPREDNISolone  (MEDROL  DOSEPAK) 4 MG TBPK tablet        11/21/24 0759  lidocaine  (LIDODERM ) 5 %  Every 24 hours        11/21/24 0759    cyclobenzaprine  (FLEXERIL ) 10 MG tablet  2 times daily PRN        11/21/24 0759               Ruthe Cornet, DO 11/21/24 0802

## 2024-11-21 NOTE — Discharge Instructions (Addendum)
 Recommend 1000 mg of Tylenol  every 6 hours as needed for pain.  Take Medrol  Dosepak as prescribed with your next dose tomorrow.  Take Flexeril  as needed for muscle spasms.  This is sedating medication.  Please be careful with its use.  Do not mix alcohol drugs or dangerous activities including driving.  Recommend buying lidocaine  patches over-the-counter as well.

## 2025-01-10 ENCOUNTER — Other Ambulatory Visit: Payer: Self-pay | Admitting: Adult Health

## 2025-01-10 DIAGNOSIS — M25561 Pain in right knee: Secondary | ICD-10-CM

## 2025-01-10 DIAGNOSIS — G8929 Other chronic pain: Secondary | ICD-10-CM

## 2025-01-19 ENCOUNTER — Inpatient Hospital Stay: Admission: RE | Admit: 2025-01-19 | Source: Ambulatory Visit
